# Patient Record
Sex: Male | Born: 1985 | Race: White | Hispanic: No | Marital: Married | Smoking: Never smoker
Health system: Southern US, Community
[De-identification: ages and names within clinical notes are randomized; demographics above are authoritative.]

## PROBLEM LIST (undated history)

## (undated) DIAGNOSIS — R7303 Prediabetes: Secondary | ICD-10-CM

## (undated) DIAGNOSIS — I10 Essential (primary) hypertension: Secondary | ICD-10-CM

## (undated) DIAGNOSIS — E785 Hyperlipidemia, unspecified: Secondary | ICD-10-CM

## (undated) HISTORY — DX: Essential (primary) hypertension: I10

## (undated) HISTORY — PX: TONSILLECTOMY: SUR1361

## (undated) HISTORY — DX: Hyperlipidemia, unspecified: E78.5

## (undated) HISTORY — PX: CHOLECYSTECTOMY: SHX55

## (undated) HISTORY — DX: Prediabetes: R73.03

## (undated) HISTORY — PX: KNEE ARTHROSCOPY WITH ANTERIOR CRUCIATE LIGAMENT (ACL) REPAIR: SHX5644

---
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
## 2009-01-24 ENCOUNTER — Emergency Department: Payer: Self-pay | Admitting: Emergency Medicine

## 2009-07-10 ENCOUNTER — Emergency Department: Payer: Self-pay | Admitting: Unknown Physician Specialty

## 2009-10-22 ENCOUNTER — Emergency Department: Payer: Self-pay | Admitting: Emergency Medicine

## 2009-10-26 ENCOUNTER — Emergency Department: Payer: Self-pay | Admitting: Emergency Medicine

## 2012-03-12 ENCOUNTER — Ambulatory Visit: Payer: Self-pay

## 2012-11-15 ENCOUNTER — Emergency Department: Payer: Self-pay | Admitting: Emergency Medicine

## 2013-06-03 ENCOUNTER — Emergency Department: Payer: Self-pay | Admitting: Internal Medicine

## 2013-06-03 LAB — CBC WITH DIFFERENTIAL/PLATELET
Basophil #: 0.1 10*3/uL (ref 0.0–0.1)
Basophil %: 0.5 %
Eosinophil #: 0.2 10*3/uL (ref 0.0–0.7)
Eosinophil %: 1.6 %
HCT: 46.2 % (ref 40.0–52.0)
HGB: 15.5 g/dL (ref 13.0–18.0)
Lymphocyte #: 2.1 10*3/uL (ref 1.0–3.6)
Lymphocyte %: 17.6 %
MCH: 29.1 pg (ref 26.0–34.0)
MCHC: 33.6 g/dL (ref 32.0–36.0)
MCV: 87 fL (ref 80–100)
Monocyte #: 1 x10 3/mm (ref 0.2–1.0)
Monocyte %: 8.3 %
Neutrophil #: 8.5 10*3/uL — ABNORMAL HIGH (ref 1.4–6.5)
Neutrophil %: 72 %
Platelet: 220 10*3/uL (ref 150–440)
RBC: 5.32 10*6/uL (ref 4.40–5.90)
RDW: 12.7 % (ref 11.5–14.5)
WBC: 11.8 10*3/uL — ABNORMAL HIGH (ref 3.8–10.6)

## 2013-06-03 LAB — COMPREHENSIVE METABOLIC PANEL
Albumin: 3.6 g/dL (ref 3.4–5.0)
Alkaline Phosphatase: 64 U/L
Anion Gap: 1 — ABNORMAL LOW (ref 7–16)
BUN: 12 mg/dL (ref 7–18)
Bilirubin,Total: 0.4 mg/dL (ref 0.2–1.0)
Calcium, Total: 9.4 mg/dL (ref 8.5–10.1)
Chloride: 103 mmol/L (ref 98–107)
Co2: 31 mmol/L (ref 21–32)
Creatinine: 0.84 mg/dL (ref 0.60–1.30)
EGFR (African American): >60
EGFR (Non-African Amer.): >60
Glucose: 73 mg/dL (ref 65–99)
Osmolality: 268 (ref 275–301)
Potassium: 4.5 mmol/L (ref 3.5–5.1)
SGOT(AST): 27 U/L (ref 15–37)
SGPT (ALT): 45 U/L (ref 12–78)
Sodium: 135 mmol/L — ABNORMAL LOW (ref 136–145)
Total Protein: 7.7 g/dL (ref 6.4–8.2)

## 2013-06-03 LAB — MONONUCLEOSIS SCREEN: Mono Test: NEGATIVE

## 2013-06-05 LAB — BETA STREP CULTURE(ARMC)

## 2014-05-10 ENCOUNTER — Emergency Department: Payer: Self-pay | Admitting: Emergency Medicine

## 2017-03-08 DIAGNOSIS — K8020 Calculus of gallbladder without cholecystitis without obstruction: Secondary | ICD-10-CM | POA: Insufficient documentation

## 2017-03-08 DIAGNOSIS — Z9049 Acquired absence of other specified parts of digestive tract: Secondary | ICD-10-CM | POA: Insufficient documentation

## 2017-08-07 ENCOUNTER — Emergency Department
Admission: EM | Admit: 2017-08-07 | Discharge: 2017-08-07 | Disposition: A | Discharge status: Home / Self Care | Payer: Self-pay | Attending: Emergency Medicine | Admitting: Emergency Medicine

## 2017-08-07 ENCOUNTER — Encounter: Payer: Self-pay | Admitting: Emergency Medicine

## 2017-08-07 ENCOUNTER — Other Ambulatory Visit: Payer: Self-pay

## 2017-08-07 DIAGNOSIS — K047 Periapical abscess without sinus: Secondary | ICD-10-CM | POA: Insufficient documentation

## 2017-08-07 MED ORDER — MAGIC MOUTHWASH W/LIDOCAINE: 5.0000 mL | Freq: Four times a day (QID) | ORAL | 0 refills | Status: DC

## 2017-08-07 MED ORDER — NAPROXEN 500 MG PO TABS: 500.0000 mg | ORAL_TABLET | Freq: Two times a day (BID) | ORAL | 0 refills | Status: DC

## 2017-08-07 MED ORDER — AMOXICILLIN 875 MG PO TABS: 875.0000 mg | ORAL_TABLET | Freq: Two times a day (BID) | ORAL | 0 refills | Status: DC

## 2017-08-07 MED ORDER — NAPROXEN 500 MG PO TABS
500.0000 mg | ORAL_TABLET | Freq: Once | ORAL | Status: AC
  Administered 2017-08-07: 500 mg via ORAL
  Filled 2017-08-07: qty 1

## 2017-08-07 MED ORDER — LIDOCAINE-EPINEPHRINE 2 %-1:100000 IJ SOLN
1.7000 mL | Freq: Once | INTRAMUSCULAR | Status: DC
  Filled 2017-08-07: qty 1.7

## 2017-08-07 NOTE — ED Provider Notes (Signed)
[facility] Emergency Department Provider Note  ____________________________________________  Time seen: Approximately 8:30 PM  I have reviewed the triage vital signs and the nursing notes.   HISTORY  Chief Complaint Otalgia    HPI Jonathan Sherman is a 32 y.o. male who presents the emergency department complaining of left upper dental and left ear pain.  Patient reports that this is been ongoing times 6 days.  Patient thinks he has a dental infection.  No fevers or chills, difficulty breathing or swallowing, sore throat.  Patient states that it is a throbbing sensation, constant, worse with eating especially on the left upper side.  Patient does not have a dentist.  No other complaints at this time.  Patient denies any headache, visual changes, nasal congestion, sinus pressure, sore throat, chest pain, shortness of breath, abdominal pain, nausea vomiting.  No medication for this complaint prior to arrival.  History reviewed. No pertinent past medical history.  There are no active problems to display for this patient.   Past Surgical History:  Procedure Laterality Date  . CHOLECYSTECTOMY    . KNEE ARTHROSCOPY WITH ANTERIOR CRUCIATE LIGAMENT (ACL) REPAIR Left   . TONSILLECTOMY      Prior to Admission medications   Medication Sig Start Date End Date Taking? Authorizing Provider  amoxicillin (AMOXIL) 875 MG tablet Take 1 tablet (875 mg total) by mouth 2 (two) times daily. 08/07/17   Wyatt Thorstenson, Delorise Royals, PA-C  magic mouthwash w/lidocaine SOLN Take 5 mLs by mouth 4 (four) times daily. 08/07/17   Ziomara Birenbaum, Delorise Royals, PA-C  naproxen (NAPROSYN) 500 MG tablet Take 1 tablet (500 mg total) by mouth 2 (two) times daily with a meal. 08/07/17   Levar Fayson, Delorise Royals, PA-C    Allergies Patient has no known allergies.  No family history on file.  Social History Social History   Tobacco Use  . Smoking status: Never Smoker  . Smokeless tobacco: Never Used  Substance  Use Topics  . Alcohol use: Not on file  . Drug use: Not on file     Review of Systems  Constitutional: No fever/chills Eyes: No visual changes. No discharge ENT: Left upper dental pain, left ear pain Cardiovascular: no chest pain. Respiratory: no cough. No SOB. Gastrointestinal: No abdominal pain.  No nausea, no vomiting.  No diarrhea.  No constipation. Musculoskeletal: Negative for musculoskeletal pain. Skin: Negative for rash, abrasions, lacerations, ecchymosis. Neurological: Negative for headaches, focal weakness or numbness. 10-point ROS otherwise negative.  ____________________________________________   PHYSICAL EXAM:  VITAL SIGNS: ED Triage Vitals [08/07/17 2004]  Enc Vitals Group     BP (!) 164/100     Pulse Rate 99     Resp 20     Temp 98.3 F (36.8 C)     Temp Source Oral     SpO2 98 %     Weight (!) 320 lb (145.2 kg)     Height 6' (1.829 m)     Head Circumference      Peak Flow      Pain Score 10     Pain Loc      Pain Edu?      Excl. in [place]?      Constitutional: Alert and oriented. Well appearing and in no acute distress. Eyes: Conjunctivae are normal. PERRL. EOMI. Head: Atraumatic. ENT:      Ears:       Nose: No congestion/rhinnorhea.      Mouth/Throat: Mucous membranes are moist.  Patient has a  fractured tooth #16.  Patient is very tender to palpation over this tooth.  No significant surrounding erythema or edema.  No pustular drainage.  Uvula is midline.  Oropharynx is otherwise nonerythematous and nonedematous. Neck: No stridor.  Neck is supple full range of motion Hematological/Lymphatic/Immunilogical: No cervical lymphadenopathy. Cardiovascular: Normal rate, regular rhythm. Normal S1 and S2.  Good peripheral circulation. Respiratory: Normal respiratory effort without tachypnea or retractions. Lungs CTAB. Good air entry to the bases with no decreased or absent breath sounds. Musculoskeletal: Full range of motion to all extremities. No gross  deformities appreciated. Neurologic:  Normal speech and language. No gross focal neurologic deficits are appreciated.  Skin:  Skin is warm, dry and intact. No rash noted. Psychiatric: Mood and affect are normal. Speech and behavior are normal. Patient exhibits appropriate insight and judgement.   ____________________________________________   LABS (all labs ordered are listed, but only abnormal results are displayed)  Labs Reviewed - No data to display ____________________________________________  EKG   ____________________________________________  RADIOLOGY   No results found.  ____________________________________________    PROCEDURES  Procedure(s) performed:    .Nerve Block Date/Time: 08/07/2017 8:32 PM Performed by: Racheal Patchesuthriell, Roshunda Keir D, PA-C Authorized by: Racheal Patchesuthriell, Jaidon Sponsel D, PA-C   Consent:    Consent obtained:  Verbal   Consent given by:  Patient   Risks discussed:  Pain and unsuccessful block Indications:    Indications:  Pain relief Location:    Body area:  Head   Head nerve blocked: Posterior Superior Alveolar Nerve.   Laterality:  Left Procedure details (see MAR for exact dosages):    Block needle gauge:  27 G   Anesthetic injected:  Lidocaine 1% WITH epi   Steroid injected:  None   Additive injected:  None   Injection procedure:  Anatomic landmarks identified, introduced needle, incremental injection and negative aspiration for blood Post-procedure details:    Outcome:  Pain relieved   Patient tolerance of procedure:  Tolerated well, no immediate complications      Medications  lidocaine-EPINEPHrine (XYLOCAINE W/EPI) 2 %-1:100000 (with pres) injection 1.7 mL (not administered)     ____________________________________________   INITIAL IMPRESSION / ASSESSMENT AND PLAN / ED COURSE  Pertinent labs & imaging results that were available during my care of the patient were reviewed by me and considered in my medical decision making (see  chart for details).  Review of the [place] CSRS was performed in accordance of the [facility] prior to dispensing any controlled drugs.     Patient's diagnosis is consistent with dental infection.  Patient presents the emergency department with left upper dental pain radiating to the left ear.  Exam consistent with fracture dentition with likely infection.  No indication of deep abscess or deep space infection.  Patient is given a dental block as described above for symptom relief.  Patient will be prescribed antibiotics, anti-inflammatories, Magic mouthwash for symptom control.  Patient will follow up with dentist for further management..Patient is given ED precautions to return to the ED for any worsening or new symptoms.     ____________________________________________  FINAL CLINICAL IMPRESSION(S) / ED DIAGNOSES  Final diagnoses:  Dental infection      NEW MEDICATIONS STARTED DURING THIS VISIT:  ED Discharge Orders        Ordered    amoxicillin (AMOXIL) 875 MG tablet  2 times daily     08/07/17 2057    magic mouthwash w/lidocaine SOLN  4 times daily    Comments:  Dispense in a 1/1/1  ratio. Use lidocaine, diphenhydramine, prednisolone   08/07/17 2057    naproxen (NAPROSYN) 500 MG tablet  2 times daily with meals     08/07/17 2057          This chart was dictated using voice recognition software/Dragon. Despite best efforts to proofread, errors can occur which can change the meaning. Any change was purely unintentional.    Racheal Patches, PA-C 08/07/17 2059    Minna Antis, MD 08/07/17 2311

## 2017-08-07 NOTE — ED Triage Notes (Signed)
Patient ambulatory to triage with steady gait, without difficulty or distress noted; pt reports last several days having ?left lower dental pain radiating into ear; denies recent illness

## 2018-06-23 ENCOUNTER — Encounter: Payer: Self-pay | Admitting: Emergency Medicine

## 2018-06-23 ENCOUNTER — Other Ambulatory Visit: Payer: Self-pay

## 2018-06-23 ENCOUNTER — Emergency Department
Admission: EM | Admit: 2018-06-23 | Discharge: 2018-06-23 | Disposition: A | Discharge status: Home / Self Care | Payer: Self-pay | Attending: Student in an Organized Health Care Education/Training Program | Admitting: Student in an Organized Health Care Education/Training Program

## 2018-06-23 DIAGNOSIS — Z79899 Other long term (current) drug therapy: Secondary | ICD-10-CM | POA: Insufficient documentation

## 2018-06-23 DIAGNOSIS — G5603 Carpal tunnel syndrome, bilateral upper limbs: Secondary | ICD-10-CM | POA: Insufficient documentation

## 2018-06-23 DIAGNOSIS — Z9049 Acquired absence of other specified parts of digestive tract: Secondary | ICD-10-CM | POA: Insufficient documentation

## 2018-06-23 MED ORDER — PREDNISONE 10 MG PO TABS: 10.0000 mg | ORAL_TABLET | Freq: Every day | ORAL | 0 refills | Status: DC

## 2018-06-23 NOTE — ED Provider Notes (Signed)
[facility] Emergency Department Provider Note    First MD Initiated Contact with Patient 06/23/18 1054     (approximate)  I have reviewed the triage vital signs and the nursing notes.   HISTORY  Chief Complaint Numbness    HPI Jonathan Sherman is a 33 y.o. male presents the ED R for evaluation of several months of paresthesia and pain particularly in his second and third long fingers of his right hand but affecting both hands.  States that it is worse at night and when he first wakes up in the morning.  States that when the pain is very severe it will shoot up his right arm.  Denies any chest pain or shortness of breath.  No neck pain.  No other symptoms noticed.  States that he has to roll over and wake up in the melanite and shake his hand out.  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  Just recently got health insurance and that is why is presenting to the ER.  Has not been established with PCP.  Is any other chronic medical conditions.  History reviewed. No pertinent past medical history. History reviewed. No pertinent family history. Past Surgical History:  Procedure Laterality Date  . CHOLECYSTECTOMY    . KNEE ARTHROSCOPY WITH ANTERIOR CRUCIATE LIGAMENT (ACL) REPAIR Left   . TONSILLECTOMY     There are no active problems to display for this patient.     Prior to Admission medications   Medication Sig Start Date End Date Taking? Authorizing Provider  amoxicillin (AMOXIL) 875 MG tablet Take 1 tablet (875 mg total) by mouth 2 (two) times daily. 08/07/17   Cuthriell, Delorise Royals, PA-C  magic mouthwash w/lidocaine SOLN Take 5 mLs by mouth 4 (four) times daily. 08/07/17   Cuthriell, Delorise Royals, PA-C  naproxen (NAPROSYN) 500 MG tablet Take 1 tablet (500 mg total) by mouth 2 (two) times daily with a meal. 08/07/17   Cuthriell, Delorise Royals, PA-C  predniSONE (DELTASONE) 10 MG tablet Take 1 tablet (10 mg total) by mouth daily. Day 1-2: Take 50  mg  ( 5 pills) Day 3-4 : Take 40 mg (4pills) Day 5-6: Take 30 mg (3 pills) Day 7-8:  Take 20 mg (2 pills) Day 9:  Take 10mg  (1 pill) 06/23/18   Willy Eddy, MD    Allergies Patient has no known allergies.    Social History Social History   Tobacco Use  . Smoking status: Never Smoker  . Smokeless tobacco: Never Used  Substance Use Topics  . Alcohol use: Never    Frequency: Never  . Drug use: Never    Review of Systems Patient denies headaches, rhinorrhea, blurry vision, numbness, shortness of breath, chest pain, edema, cough, abdominal pain, nausea, vomiting, diarrhea, dysuria, fevers, rashes or hallucinations unless otherwise stated above in HPI. ____________________________________________   PHYSICAL EXAM:  VITAL SIGNS: Vitals:   06/23/18 1044  BP: (!) 164/99  Pulse: 97  Resp: 18  Temp: 98.2 F (36.8 C)  SpO2: 100%    Constitutional: Alert and oriented. Well appearing and in no acute distress. Eyes: Conjunctivae are normal.  Head: Atraumatic. Nose: No congestion/rhinnorhea. Mouth/Throat: Mucous membranes are moist.   Neck: Painless ROM.  Cardiovascular:   Good peripheral circulation. Respiratory: Normal respiratory effort.  No retractions.  Gastrointestinal: Soft and nontender.  Musculoskeletal: Good cap refill to all 10 digits the upper extremities.  Symptoms are reproduced with phalens maneuver and has a positive tinnels  sign of the right.  Some paresthesia noted but silt.   no lower extremity tenderness .  No joint effusions. Neurologic:  Normal speech and language. No gross focal neurologic deficits are appreciated.  Skin:  Skin is warm, dry and intact. No rash noted. Psychiatric: Mood and affect are normal. Speech and behavior are normal.  ____________________________________________   LABS (all labs ordered are listed, but only abnormal results are displayed)  No results found for this or any previous visit (from the past 24  hour(s)). ____________________________________________ ____________________________________________   PROCEDURES  Procedure(s) performed:  Procedures    Critical Care performed: no ____________________________________________   INITIAL IMPRESSION / ASSESSMENT AND PLAN / ED COURSE  Pertinent labs & imaging results that were available during my care of the patient were reviewed by me and considered in my medical decision making (see chart for details).  DDX: carpal tunnel, radiculopathy, cord syndrome  FRAN SCHULENBERG is a 33 y.o. who presents to the ED with symptoms as described above.  Patient well-appearing.  Symptoms ongoing for several months.  Physical exam is consistent with carpal tunnel.  No evidence of more proximal lesion.  No indication for emergent diagnostic imaging as this is carpal tunnel.  Discussed conservative measures.  Will start on prednisone taper discussed anti-inflammatories as well as wrist splinting with need for orthopedic follow-up as he does have moderate symptoms.  Have discussed with the patient and available family all diagnostics and treatments performed thus far and all questions were answered to the best of my ability. The patient demonstrates understanding and agreement with plan.       ____________________________________________   FINAL CLINICAL IMPRESSION(S) / ED DIAGNOSES  Final diagnoses:  Bilateral carpal tunnel syndrome      NEW MEDICATIONS STARTED DURING THIS VISIT:  New Prescriptions   PREDNISONE (DELTASONE) 10 MG TABLET    Take 1 tablet (10 mg total) by mouth daily. Day 1-2: Take 50 mg  ( 5 pills) Day 3-4 : Take 40 mg (4pills) Day 5-6: Take 30 mg (3 pills) Day 7-8:  Take 20 mg (2 pills) Day 9:  Take 10mg  (1 pill)     Note:  This document was prepared using Dragon voice recognition software and may include unintentional dictation errors.     Willy Eddy, MD 06/23/18 1115

## 2018-06-23 NOTE — ED Triage Notes (Signed)
Pt here for numbness to both hands intermittent mostly while sleeping over last 3 weeks.  The numbness has been getting more consistent and occurring during day as well and now has not gone away in right hand.  just got insurance and working on getting PCP.  No injury or neck pain.

## 2018-11-11 ENCOUNTER — Emergency Department: Payer: 59

## 2018-11-11 ENCOUNTER — Encounter: Payer: Self-pay | Admitting: *Deleted

## 2018-11-11 ENCOUNTER — Other Ambulatory Visit: Payer: Self-pay

## 2018-11-11 ENCOUNTER — Emergency Department
Admission: EM | Admit: 2018-11-11 | Discharge: 2018-11-12 | Discharge status: Against Medical Advice | Payer: 59 | Attending: Emergency Medicine | Admitting: Emergency Medicine

## 2018-11-11 DIAGNOSIS — Z79899 Other long term (current) drug therapy: Secondary | ICD-10-CM | POA: Insufficient documentation

## 2018-11-11 DIAGNOSIS — N200 Calculus of kidney: Secondary | ICD-10-CM

## 2018-11-11 DIAGNOSIS — N130 Hydronephrosis with ureteropelvic junction obstruction: Secondary | ICD-10-CM | POA: Insufficient documentation

## 2018-11-11 DIAGNOSIS — R109 Unspecified abdominal pain: Secondary | ICD-10-CM | POA: Diagnosis present

## 2018-11-11 LAB — URINALYSIS, COMPLETE (UACMP) WITH MICROSCOPIC
Bacteria, UA: NONE SEEN
Bilirubin Urine: NEGATIVE
Glucose, UA: NEGATIVE mg/dL
Ketones, ur: NEGATIVE mg/dL
Leukocytes,Ua: NEGATIVE
Nitrite: NEGATIVE
Protein, ur: 30 mg/dL — AB
RBC / HPF: >50 RBC/hpf — ABNORMAL HIGH (ref 0–5)
Specific Gravity, Urine: 1.031 — ABNORMAL HIGH (ref 1.005–1.030)
pH: 5 (ref 5.0–8.0)

## 2018-11-11 MED ORDER — SODIUM CHLORIDE 0.9 % IV BOLUS: 1000.0000 mL | Freq: Once | INTRAVENOUS | Status: DC

## 2018-11-11 MED ORDER — OXYCODONE-ACETAMINOPHEN 5-325 MG PO TABS
1.0000 | ORAL_TABLET | ORAL | Status: AC | PRN
  Administered 2018-11-11 – 2018-11-12 (×2): 1 via ORAL
  Filled 2018-11-11 (×2): qty 1

## 2018-11-11 MED ORDER — TAMSULOSIN HCL 0.4 MG PO CAPS
0.4000 mg | ORAL_CAPSULE | Freq: Once | ORAL | Status: AC
  Administered 2018-11-12: 0.4 mg via ORAL
  Filled 2018-11-11: qty 1

## 2018-11-11 MED ORDER — KETOROLAC TROMETHAMINE 30 MG/ML IJ SOLN
15.0000 mg | Freq: Once | INTRAMUSCULAR | Status: DC
  Filled 2018-11-11: qty 1

## 2018-11-11 MED ORDER — ONDANSETRON HCL 4 MG/2ML IJ SOLN
4.0000 mg | Freq: Once | INTRAMUSCULAR | Status: DC
  Filled 2018-11-11: qty 2

## 2018-11-11 NOTE — ED Triage Notes (Signed)
Pt to Ed with left sided flank pain x 6 hours. Pain is not changing with movement or rest. Pt reports having tried heat pads without success. Ibuprofen taken at home with no relief. PT has been able to urinate but reports a tingling when doing so. No hx of kidney stones.

## 2018-11-11 NOTE — ED Provider Notes (Signed)
[facility] Emergency Department Provider Note  ____________________________________________  Time seen: Approximately 11:20 PM  I have reviewed the triage vital signs and the nursing notes.   HISTORY  Chief Complaint Flank Pain   HPI Jonathan Sherman is a 33 y.o. male no significant past medical history presents for evaluation of flank pain.  Pain started today.  Sharp, located in the left flank, constant and nonradiating, currently 8 out of 10, associated with nausea.  No vomiting, no fever, no dysuria or hematuria.  No prior history of kidney stones.  No chest pain, cough, shortness of breath, or fever.  PMH Obesity  Past Surgical History:  Procedure Laterality Date  . CHOLECYSTECTOMY    . KNEE ARTHROSCOPY WITH ANTERIOR CRUCIATE LIGAMENT (ACL) REPAIR Left   . TONSILLECTOMY      Prior to Admission medications   Medication Sig Start Date End Date Taking? Authorizing Provider  amoxicillin (AMOXIL) 875 MG tablet Take 1 tablet (875 mg total) by mouth 2 (two) times daily. 08/07/17   Cuthriell, Charline Bills, PA-C  magic mouthwash w/lidocaine SOLN Take 5 mLs by mouth 4 (four) times daily. 08/07/17   Cuthriell, Charline Bills, PA-C  naproxen (NAPROSYN) 500 MG tablet Take 1 tablet (500 mg total) by mouth 2 (two) times daily with a meal. 08/07/17   Cuthriell, Charline Bills, PA-C  predniSONE (DELTASONE) 10 MG tablet Take 1 tablet (10 mg total) by mouth daily. Day 1-2: Take 50 mg  ( 5 pills) Day 3-4 : Take 40 mg (4pills) Day 5-6: Take 30 mg (3 pills) Day 7-8:  Take 20 mg (2 pills) Day 9:  Take 10mg  (1 pill) 06/23/18   Merlyn Lot, MD    Allergies Patient has no known allergies.  FH Diabetes Brother    Diabetes Brother    Heart disease Father    Cancer Mother    Dementia Mother       Social History Social History   Tobacco Use  . Smoking status: Never Smoker  . Smokeless tobacco: Never Used  Substance Use Topics  . Alcohol use: Never   Frequency: Never  . Drug use: Never    Review of Systems  Constitutional: Negative for fever. Eyes: Negative for visual changes. ENT: Negative for sore throat. Neck: No neck pain  Cardiovascular: Negative for chest pain. Respiratory: Negative for shortness of breath. Gastrointestinal: Negative for abdominal pain, vomiting or diarrhea. Genitourinary: Negative for dysuria. + L flank pain Musculoskeletal: Negative for back pain. Skin: Negative for rash. Neurological: Negative for headaches, weakness or numbness. Psych: No SI or HI  ____________________________________________   PHYSICAL EXAM:  VITAL SIGNS: ED Triage Vitals [11/11/18 1900]  Enc Vitals Group     BP (!) 151/99     Pulse Rate (!) 103     Resp 16     Temp 98.7 F (37.1 C)     Temp Source Oral     SpO2 97 %     Weight (!) 320 lb 1.7 oz (145.2 kg)     Height 5\' 10"  (1.778 m)     Head Circumference      Peak Flow      Pain Score 10     Pain Loc      Pain Edu?      Excl. in [place]?     Constitutional: Alert and oriented. Well appearing and in no apparent distress. HEENT:      Head: Normocephalic and atraumatic.  Eyes: Conjunctivae are normal. Sclera is non-icteric.       Mouth/Throat: Mucous membranes are moist.       Neck: Supple with no signs of meningismus. Cardiovascular: Tachycardic with regular rhythm. No murmurs, gallops, or rubs. 2+ symmetrical distal pulses are present in all extremities. No JVD. Respiratory: Normal respiratory effort. Lungs are clear to auscultation bilaterally. No wheezes, crackles, or rhonchi.  Gastrointestinal: Soft, non tender, and non distended with positive bowel sounds. No rebound or guarding. Genitourinary: L CVA tenderness. Musculoskeletal: Nontender with normal range of motion in all extremities. No edema, cyanosis, or erythema of extremities. Neurologic: Normal speech and language. Face is symmetric. Moving all extremities. No gross focal neurologic deficits are  appreciated. Skin: Skin is warm, dry and intact. No rash noted. Psychiatric: Mood and affect are normal. Speech and behavior are normal.  ____________________________________________   LABS (all labs ordered are listed, but only abnormal results are displayed)  Labs Reviewed  URINALYSIS, COMPLETE (UACMP) WITH MICROSCOPIC - Abnormal; Notable for the following components:      Result Value   Color, Urine YELLOW (*)    APPearance HAZY (*)    Specific Gravity, Urine 1.031 (*)    Hgb urine dipstick LARGE (*)    Protein, ur 30 (*)    RBC / HPF >50 (*)    All other components within normal limits  CBC WITH DIFFERENTIAL/PLATELET  COMPREHENSIVE METABOLIC PANEL   ____________________________________________  EKG  None  ____________________________________________  RADIOLOGY  I have personally reviewed the images performed during this visit and I agree with the Radiologist's read.   Interpretation by Radiologist:  Ct Renal Stone Study  Result Date: 11/11/2018 CLINICAL DATA:  Left flank pain. EXAM: CT ABDOMEN AND PELVIS WITHOUT CONTRAST TECHNIQUE: Multidetector CT imaging of the abdomen and pelvis was performed following the standard protocol without IV contrast. COMPARISON:  None. FINDINGS: Lower chest: Scattered subsegmental atelectasis. No pleural fluid or consolidation. Hepatobiliary: Enlarged liver spanning 23.6 cm. Diffuse hepatic steatosis. No focal abnormality. Clips in the gallbladder fossa postcholecystectomy. No biliary dilatation. Pancreas: No ductal dilatation or inflammation. Spleen: Upper normal in size at 14.1 cm AP. Punctate granuloma. Adrenals/Urinary Tract: Normal adrenal glands. Obstructing 4 mm stone at the left ureterovesicular junction with mild hydroureteronephrosis and perinephric edema. No right hydronephrosis. No additional nonobstructing stone in either kidney. Urinary bladder is partially distended. No bladder stone. Stomach/Bowel: Stomach distended with ingested  material. Appendix appears normal. No evidence of bowel wall thickening, distention, or inflammatory changes. Vascular/Lymphatic: Enlarged lymph nodes in the abdomen or pelvis. Negative noncontrast vascular structures. Reproductive: Prostate is unremarkable. Other: No free air, free fluid, or intra-abdominal fluid collection. Musculoskeletal: Incidental vertebral body hemangioma within L2. L5-S1 degenerative disc disease. There are no acute or suspicious osseous abnormalities. IMPRESSION: 1. Obstructing 4 mm stone at the left ureterovesicular junction with mild hydronephrosis. 2. Hepatic steatosis and hepatomegaly. Electronically Signed   By: Narda RutherfordMelanie  Sanford M.D.   On: 11/11/2018 23:41     ____________________________________________   PROCEDURES  Procedure(s) performed: None Procedures Critical Care performed:  None ____________________________________________   INITIAL IMPRESSION / ASSESSMENT AND PLAN / ED COURSE   33 y.o. male no significant past medical history presents for evaluation of flank pain.  Presentation concerning for kidney stone.  CT confirms an obstructing 4 mm stone at the left UVJ with mild hydronephrosis.  UA with no overlying infection.  Will give Flomax, Toradol, Zofran.  Will check basic labs.  Anticipate discharge home once pain is well controlled with  follow-up with urology    _________________________ 1:25 AM on 11/12/2018 -----------------------------------------  Patient received flomax and percocet. Discussed the results of CT, follow up with Urology and standard return precautions. Patient was a hard stick. IV team was consulted and patient was waiting for IV placement and blood work. I went back to re-evaluate patient after percocet and flomax and patient had left prior to labs with no prescriptions.    As part of my medical decision making, I reviewed the following data within the electronic MEDICAL RECORD NUMBER Nursing notes reviewed and incorporated, Old chart  reviewed, Radiograph reviewed , Notes from prior ED visits and [place] Controlled Substance Database    Pertinent labs & imaging results that were available during my care of the patient were reviewed by me and considered in my medical decision making (see chart for details).    ____________________________________________   FINAL CLINICAL IMPRESSION(S) / ED DIAGNOSES  Final diagnoses:  Kidney stone      NEW MEDICATIONS STARTED DURING THIS VISIT:  ED Discharge Orders    None       Note:  This document was prepared using Dragon voice recognition software and may include unintentional dictation errors.    Don PerkingVeronese, [place], MD 11/12/18 [phone]

## 2018-11-12 NOTE — ED Notes (Signed)
Pt appears to have left the ED.

## 2018-12-12 ENCOUNTER — Other Ambulatory Visit: Payer: Self-pay

## 2018-12-12 ENCOUNTER — Emergency Department: Payer: 59

## 2018-12-12 ENCOUNTER — Emergency Department
Admission: EM | Admit: 2018-12-12 | Discharge: 2018-12-12 | Disposition: A | Discharge status: Home / Self Care | Payer: 59 | Attending: Emergency Medicine | Admitting: Emergency Medicine

## 2018-12-12 DIAGNOSIS — Y939 Activity, unspecified: Secondary | ICD-10-CM | POA: Insufficient documentation

## 2018-12-12 DIAGNOSIS — X501XXA Overexertion from prolonged static or awkward postures, initial encounter: Secondary | ICD-10-CM | POA: Insufficient documentation

## 2018-12-12 DIAGNOSIS — Y990 Civilian activity done for income or pay: Secondary | ICD-10-CM | POA: Insufficient documentation

## 2018-12-12 DIAGNOSIS — S8992XA Unspecified injury of left lower leg, initial encounter: Secondary | ICD-10-CM | POA: Insufficient documentation

## 2018-12-12 DIAGNOSIS — Y929 Unspecified place or not applicable: Secondary | ICD-10-CM | POA: Diagnosis not present

## 2018-12-12 NOTE — ED Triage Notes (Signed)
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

## 2018-12-12 NOTE — ED Notes (Signed)
See triage note  States he slipped about 1 week ago while at work  Was not seen at that time  But this am states his left knee buckled while at home .

## 2018-12-12 NOTE — ED Provider Notes (Signed)
[facility] Emergency Department Provider Note   ____________________________________________   First MD Initiated Contact with Patient 12/12/18 [id number]     (approximate)  I have reviewed the triage vital signs and the nursing notes.   HISTORY  Chief Complaint Knee Injury    HPI Jonathan Sherman is a 33 y.o. male patient complain of right knee pain and edema secondary to a twisting incident at work 1 week ago.  Patient stated no relief of over-the-counter anti-inflammatory medication and wearing a patella brace.  Patient rates the pain as a 5/10.  Patient described the pain as "achy".  Medical history is significant for ACL repair 13 years ago.         History reviewed. No pertinent past medical history.  There are no active problems to display for this patient.   Past Surgical History:  Procedure Laterality Date  . CHOLECYSTECTOMY    . KNEE ARTHROSCOPY WITH ANTERIOR CRUCIATE LIGAMENT (ACL) REPAIR Left   . TONSILLECTOMY      Prior to Admission medications   Not on File    Allergies Patient has no known allergies.  No family history on file.  Social History Social History   Tobacco Use  . Smoking status: Never Smoker  . Smokeless tobacco: Never Used  Substance Use Topics  . Alcohol use: Never    Frequency: Never  . Drug use: Never    Review of Systems  Constitutional: No fever/chills Eyes: No visual changes. ENT: No sore throat. Cardiovascular: Denies chest pain. Respiratory: Denies shortness of breath. Gastrointestinal: No abdominal pain.  No nausea, no vomiting.  No diarrhea.  No constipation. Genitourinary: Negative for dysuria. Musculoskeletal: Left knee pain.   Skin: Negative for rash. Neurological: Negative for headaches, focal weakness or numbness.   ____________________________________________   PHYSICAL EXAM:  VITAL SIGNS: ED Triage Vitals  Enc Vitals Group     BP 12/12/18 0858 (!) 150/105     Pulse Rate  12/12/18 0858 (!) 110     Resp 12/12/18 0858 20     Temp 12/12/18 0858 98.8 F (37.1 C)     Temp Source 12/12/18 0858 Oral     SpO2 12/12/18 0858 99 %     Weight 12/12/18 0854 (!) 320 lb (145.2 kg)     Height 12/12/18 0854 6' (1.829 m)     Head Circumference --      Peak Flow --      Pain Score 12/12/18 0854 5     Pain Loc --      Pain Edu? --      Excl. in GC? --     Constitutional: Alert and oriented. Well appearing and in no acute distress.  Morbid obesity. Cardiovascular: Normal rate, regular rhythm. Grossly normal heart sounds.  Good peripheral circulation.  Elevated blood pressure. Respiratory: Normal respiratory effort.  No retractions. Lungs CTAB. Musculoskeletal: No obvious deformity.  Moderate guarding palpation anterior and medial patella.  No effusion. Neurologic:  Normal speech and language. No gross focal neurologic deficits are appreciated. No gait instability. Skin:  Skin is warm, dry and intact. No rash noted. Psychiatric: Mood and affect are normal. Speech and behavior are normal.  ____________________________________________   LABS (all labs ordered are listed, but only abnormal results are displayed)  Labs Reviewed - No data to display ____________________________________________  EKG   ____________________________________________  RADIOLOGY  ED MD interpretation:    Official radiology report(s): Dg Knee Complete 4 Views Left  Result Date: 12/12/2018 CLINICAL  DATA:  Twisting injury.  Knee pain. EXAM: LEFT KNEE - COMPLETE 4+ VIEW COMPARISON:  None. FINDINGS: Prior ACL repair. Mild osteoarthritis with joint space narrowing and spurring in all 3 compartments. No acute bony abnormality. Specifically, no fracture, subluxation, or dislocation. No joint effusion. IMPRESSION: No acute bony abnormality. Electronically Signed   By: Rolm Baptise M.D.   On: 12/12/2018 10:05    ____________________________________________   PROCEDURES  Procedure(s) performed  (including Critical Care):  Procedures   ____________________________________________   INITIAL IMPRESSION / ASSESSMENT AND PLAN / ED COURSE  As part of my medical decision making, I reviewed the following data within the [place] was evaluated in Emergency Department on 12/12/2018 for the symptoms described in the history of present illness. He was evaluated in the context of the global COVID-19 pandemic, which necessitated consideration that the patient might be at risk for infection with the SARS-CoV-2 virus that causes COVID-19. Institutional protocols and algorithms that pertain to the evaluation of patients at risk for COVID-19 are in a state of rapid change based on information released by regulatory bodies including the CDC and federal and state organizations. These policies and algorithms were followed during the patient's care in the ED.   Left knee pain secondary to a twisting incident.  Differential diagnosis consists sprain versus internal derangement.  X-rays were grossly unremarkable except for postsurgical findings.  Patient will follow orthopedic for definitive evaluation and treatment.      ____________________________________________   FINAL CLINICAL IMPRESSION(S) / ED DIAGNOSES  Final diagnoses:  Injury of left knee, initial encounter     ED Discharge Orders    None       Note:  This document was prepared using Dragon voice recognition software and may include unintentional dictation errors.    Sable Feil, PA-C 12/12/18 1017    Carrie Mew, MD 12/12/18 [phone]

## 2018-12-12 NOTE — ED Notes (Signed)
Per Standley Brooking from [employer]  States this was not w/c at this time b/c it buckled while at home

## 2020-03-26 ENCOUNTER — Other Ambulatory Visit: Payer: Self-pay

## 2020-03-26 ENCOUNTER — Encounter: Payer: Self-pay | Admitting: Podiatry

## 2020-03-26 ENCOUNTER — Ambulatory Visit (INDEPENDENT_AMBULATORY_CARE_PROVIDER_SITE_OTHER): Payer: Commercial Managed Care - PPO

## 2020-03-26 ENCOUNTER — Ambulatory Visit: Payer: 59 | Admitting: Podiatry

## 2020-03-26 DIAGNOSIS — M722 Plantar fascial fibromatosis: Secondary | ICD-10-CM | POA: Diagnosis not present

## 2020-03-26 NOTE — Progress Notes (Signed)
  Subjective:  Patient ID: Jonathan Sherman, male    DOB: 04-Aug-1985,  MRN: [id number]  Chief Complaint  Patient presents with  . Foot Pain    Patient presents today for right heel pain x 1 week    34 y.o. male presents with the above complaint.  Patient presents with complaint of right heel pain that has been going for past 1 week as progressing on worse.  Patient diagnosed himself with plantar fasciitis.  He states that it is painful to walk on.  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  He states that is painful when waking up in the first morning taking the first step.  He has not tried any treatment options he would like to discuss treatment options he has not seen anyone else prior to seeing me.   Review of Systems: Negative except as noted in the HPI. Denies N/V/F/Ch.  History reviewed. No pertinent past medical history.  Current Outpatient Medications:  .  acetaminophen (TYLENOL) 325 MG tablet, Take by mouth., Disp: , Rfl:  .  hydrochlorothiazide (HYDRODIURIL) 25 MG tablet, Take by mouth., Disp: , Rfl:  .  ibuprofen (ADVIL) 200 MG tablet, Take by mouth., Disp: , Rfl:   Social History   Tobacco Use  Smoking Status Never Smoker  Smokeless Tobacco Never Used    No Known Allergies Objective:  There were no vitals filed for this visit. There is no height or weight on file to calculate BMI. Constitutional Well developed. Well nourished.  Vascular Dorsalis pedis pulses palpable bilaterally. Posterior tibial pulses palpable bilaterally. Capillary refill normal to all digits.  No cyanosis or clubbing noted. Pedal hair growth normal.  Neurologic Normal speech. Oriented to person, place, and time. Epicritic sensation to light touch grossly present bilaterally.  Dermatologic Nails well groomed and normal in appearance. No open wounds. No skin lesions.  Orthopedic: Normal joint ROM without pain or crepitus bilaterally. No visible  deformities. Tender to palpation at the calcaneal tuber right. No pain with calcaneal squeeze right. Ankle ROM diminished range of motion right. Silfverskiold Test: positive right.   Radiographs: Taken and reviewed. No acute fractures or dislocations. No evidence of stress fracture.  Plantar heel spur present. Posterior heel spur present.   Assessment:   1. Plantar fasciitis of right foot    Plan:  Patient was evaluated and treated and all questions answered.  Plantar Fasciitis, right - XR reviewed as above.  - Educated on icing and stretching. Instructions given.  - Injection delivered to the plantar fascia as below. - DME: Plantar Fascial Brace - Pharmacologic management: None  Procedure: Injection Tendon/Ligament Location: Right plantar fascia at the glabrous junction; medial approach. Skin Prep: alcohol Injectate: 0.5 cc 0.5% marcaine plain, 0.5 cc of 1% Lidocaine, 0.5 cc kenalog 10. Disposition: Patient tolerated procedure well. Injection site dressed with a band-aid.  No follow-ups on file.

## 2020-04-23 ENCOUNTER — Ambulatory Visit: Payer: Commercial Managed Care - PPO | Admitting: Podiatry

## 2020-09-23 ENCOUNTER — Ambulatory Visit: Payer: Commercial Managed Care - PPO | Attending: Otolaryngology

## 2020-09-23 DIAGNOSIS — G4733 Obstructive sleep apnea (adult) (pediatric): Secondary | ICD-10-CM | POA: Insufficient documentation

## 2020-09-24 ENCOUNTER — Other Ambulatory Visit: Payer: Self-pay

## 2021-09-01 ENCOUNTER — Other Ambulatory Visit: Payer: Self-pay

## 2021-09-01 ENCOUNTER — Encounter: Payer: Self-pay | Admitting: Emergency Medicine

## 2021-09-01 ENCOUNTER — Emergency Department
Admission: EM | Admit: 2021-09-01 | Discharge: 2021-09-01 | Disposition: A | Discharge status: Home / Self Care | Payer: Commercial Managed Care - PPO | Attending: Student in an Organized Health Care Education/Training Program | Admitting: Student in an Organized Health Care Education/Training Program

## 2021-09-01 ENCOUNTER — Emergency Department: Payer: Commercial Managed Care - PPO

## 2021-09-01 DIAGNOSIS — X500XXA Overexertion from strenuous movement or load, initial encounter: Secondary | ICD-10-CM | POA: Insufficient documentation

## 2021-09-01 DIAGNOSIS — M7711 Lateral epicondylitis, right elbow: Secondary | ICD-10-CM | POA: Insufficient documentation

## 2021-09-01 DIAGNOSIS — M25521 Pain in right elbow: Secondary | ICD-10-CM | POA: Diagnosis present

## 2021-09-01 MED ORDER — DEXAMETHASONE SODIUM PHOSPHATE 10 MG/ML IJ SOLN
10.0000 mg | Freq: Once | INTRAMUSCULAR | Status: AC
  Administered 2021-09-01: 10 mg via INTRAMUSCULAR
  Filled 2021-09-01: qty 1

## 2021-09-01 MED ORDER — IBUPROFEN 800 MG PO TABS
800.0000 mg | ORAL_TABLET | Freq: Once | ORAL | Status: AC
  Administered 2021-09-01: 800 mg via ORAL

## 2021-09-01 MED ORDER — PREDNISONE 10 MG PO TABS: ORAL_TABLET | ORAL | 0 refills | Status: DC

## 2021-09-01 MED ORDER — IBUPROFEN 800 MG PO TABS
ORAL_TABLET | ORAL | Status: AC
  Filled 2021-09-01: qty 1

## 2021-09-01 NOTE — ED Provider Notes (Signed)
? ?[facility] ?Provider Note ? ? ? Event Date/Time  ? First MD Initiated Contact with Patient 09/01/21 0913   ?  (approximate) ? ? ?History  ? ?Elbow Pain ? ? ?HPI ? ?NOVA EVETT is a 36 y.o. male   presents to the ED with complaint of right elbow pain x1 day.  Patient denies any recent injury or previous elbow problems.  Patient reports pain with movement which is worsened with grabbing something to lift.  Patient states that lifting anything within the right hand causes pain at the elbow but also along the forearm as well.  No over-the-counter medication has been taken.  Patient denies any medical problems and is a non-smoker. ? ?  ? ? ?Physical Exam  ? ?Triage Vital Signs: ?ED Triage Vitals  ?Enc Vitals Group  ?   BP 09/01/21 0910 (!) 159/99  ?   Pulse Rate 09/01/21 0910 (!) 101  ?   Resp 09/01/21 0910 18  ?   Temp 09/01/21 0910 98.2 ?F (36.8 ?C)  ?   Temp Source 09/01/21 0910 Oral  ?   SpO2 09/01/21 0910 96 %  ?   Weight 09/01/21 0907 (!) 370 lb (167.8 kg)  ?   Height 09/01/21 0907 6' (1.829 m)  ?   Head Circumference --   ?   Peak Flow --   ?   Pain Score 09/01/21 0907 10  ?   Pain Loc --   ?   Pain Edu? --   ?   Excl. in GC? --   ? ? ?Most recent vital signs: ?Vitals:  ? 09/01/21 0910  ?BP: (!) 159/99  ?Pulse: (!) 101  ?Resp: 18  ?Temp: 98.2 ?F (36.8 ?C)  ?SpO2: 96%  ? ? ? ?General: Awake, no distress.  ?CV:  Good peripheral perfusion.  ?Resp:  Normal effort.  ?Abd:  No distention.  ?Other:  On examination of the right elbow there is no gross deformity however there is marked tenderness on palpation of the lateral epicondylar area.  Pain is worsened with grasp of his right hand.  Also movement of the wrist increases his pain.  Skin is intact.  No erythema or warmth is noted to the elbow area.  Patient is able to flex and extend his right upper extremity, rotation is more difficult as it increases his pain.  Radial pulses present. ? ? ?ED Results / Procedures / Treatments  ? ?Labs ?(all  labs ordered are listed, but only abnormal results are displayed) ?Labs Reviewed - No data to display ? ? ?RADIOLOGY ?Right elbow x-ray images were reviewed by myself independently of the radiologist and was negative for fracture or spurring.  Radiology report also reports no bony abnormalities but cannot rule out a small effusion. ? ? ? ?PROCEDURES: ? ?Critical Care performed:  ? ?Procedures ? ? ?MEDICATIONS ORDERED IN ED: ?Medications  ?dexamethasone (DECADRON) injection 10 mg (has no administration in time range)  ? ? ? ?IMPRESSION / MDM / ASSESSMENT AND PLAN / ED COURSE  ?I reviewed the triage vital signs and the nursing notes. ? ? ?Differential diagnosis includes, but is not limited to, acute right elbow pain, bursitis, epicondylitis, bursitis, gouty arthritis, tendinitis. ? ?36 year old male presents to the ED with complaint of right elbow pain this morning.  Patient is right-handed and states that he has noticed that with grabbing things to lift with his right hand he has increased pain near his elbow area.  He denies any  recent injury or previous elbow problems.  He has not taken any over-the-counter medications for this.  On exam he has moderately tender lateral epicondylar area.  No erythema or warmth is noted.  X-rays were negative for acute bony injury but cannot rule out a small effusion.  I spoke with patient about a tennis elbow splint when he goes to pick up his medication at [facility].  He was made aware that if this does not help or he does not seem to be improving he should follow-up with the [facility] orthopedic department and Dr. Binnie Rail information was listed on his discharge papers. ?  ? ? ?FINAL CLINICAL IMPRESSION(S) / ED DIAGNOSES  ? ?Final diagnoses:  ?Lateral epicondylitis of right elbow  ? ? ? ?Rx / DC Orders  ? ?ED Discharge Orders   ? ?      Ordered  ?  predniSONE (DELTASONE) 10 MG tablet       ? 09/01/21 1002  ? ?  ?  ? ?  ? ? ? ?Note:  This document was prepared using Dragon voice  recognition software and may include unintentional dictation errors. ?  ?Tommi Rumps, PA-C ?09/01/21 1017 ? ?  ?Willy Eddy, MD ?09/01/21 1038 ? ?

## 2021-09-01 NOTE — ED Triage Notes (Signed)
Pt in with co right elbow pain, denies any injury. Pain worse on movement.  ?

## 2021-09-01 NOTE — Discharge Instructions (Signed)
Begin taking prednisone tapering dose beginning with 6 tablets today and tapering down by 1 tablet each day.  You may use ice to your elbow as needed for discomfort which may help reduce pain.  Also while you are at [facility] picking up your medication look in their sports section to see if they have a tennis elbow splint which is the piece of Velcro that I was telling you about that you put on the upper part of your forearm.  After you finish the steroid you may take Aleve 2 tablets twice a day with food.  Avoid lifting anything over 25 pounds with your right hand only until this is calm down.  If not improving follow-up with Dr. Joice Lofts who is the orthopedist on-call and his office is at [facility].  Call make an appointment for reevaluation. ?

## 2022-08-07 ENCOUNTER — Other Ambulatory Visit: Payer: Self-pay

## 2022-08-07 ENCOUNTER — Emergency Department
Admission: EM | Admit: 2022-08-07 | Discharge: 2022-08-07 | Disposition: A | Discharge status: Home / Self Care | Payer: Commercial Managed Care - PPO | Attending: Emergency Medicine | Admitting: Emergency Medicine

## 2022-08-07 ENCOUNTER — Emergency Department: Payer: Commercial Managed Care - PPO

## 2022-08-07 DIAGNOSIS — R030 Elevated blood-pressure reading, without diagnosis of hypertension: Secondary | ICD-10-CM | POA: Insufficient documentation

## 2022-08-07 DIAGNOSIS — M79671 Pain in right foot: Secondary | ICD-10-CM | POA: Diagnosis present

## 2022-08-07 DIAGNOSIS — F419 Anxiety disorder, unspecified: Secondary | ICD-10-CM | POA: Insufficient documentation

## 2022-08-07 MED ORDER — PREDNISONE 10 MG PO TABS: ORAL_TABLET | ORAL | 0 refills | Status: DC

## 2022-08-07 MED ORDER — HYDROCODONE-ACETAMINOPHEN 5-325 MG PO TABS: 1.0000 | ORAL_TABLET | Freq: Four times a day (QID) | ORAL | 0 refills | Status: DC | PRN

## 2022-08-07 NOTE — ED Notes (Signed)
Pt discharge to home. Pt VSS, GCS 15, NAD. Pt verbalized understanding of discharge instructions with no additional questions at this time.  

## 2022-08-07 NOTE — ED Notes (Signed)
Boot applied to pt right LE. Pt educated on use of boot and crutches. Pt verbalized understanding with no additional questions or concerns.

## 2022-08-07 NOTE — Discharge Instructions (Addendum)
Follow-up with your primary care provider to have your blood pressure rechecked as it was elevated in the emergency department and 158/110.  If you do not have a primary care provider a list of clinics is on your discharge papers for you to call make an appointment.  Also Dr. Sabra Heck is on-call for orthopedics and his contact information is listed on your discharge papers along with his address.  Follow-up with orthopedics and wear the cam walker for 7 to 10 days for support and protection.  Use the crutches as needed.  The steroids are tapering dose starting with 6 tablets today and tapering down by 1 tablet each day.  The hydrocodone is a pain medication and could cause drowsiness.  Do not drive or operate machinery while taking this medication as it could increase your chances for injury.   Please go to the following website to schedule new (and existing) patient appointments:   [email or web address]   The following is a list of primary care offices in the area who are accepting new patients at this time.  Please reach out to one of them directly and let them know you would like to schedule an appointment to follow up on an Emergency Department visit, and/or to establish a new primary care provider (PCP).  There are likely other primary care clinics in the are who are accepting new patients, but this is an excellent place to start:  [place] physician: Dr Lavon Paganini [address] [place], [place] [address] [phone]  [facility] Lead Physician: Dr Steele Sizer [address], [place], [place] [address] [phone]  [place] Physician: Dr Park Liter [address] [place], [place] [address] [phone]  [facility] Lead Physician: Dr Dewaine Oats [address] [place], [place] [address] [phone]  [facility] Physician: Dr Halina Maidens [address] [place], [place], [place] [address] [phone]

## 2022-08-07 NOTE — ED Provider Notes (Signed)
[facility] Provider Note    Event Date/Time   First MD Initiated Contact with Patient 08/07/22 [phone]     (approximate)   History   Foot Pain   HPI  Jonathan Sherman is a 37 y.o. male   presents to the ED with concerns of possible ruptured Achilles tendon to his right foot.  Patient denies any recent injury and states that he has had a history of Achilles tendinitis.  Patient states that he began having pain when he got into bed last evening.  He has been ambulatory since this event but continues to have pain.      Physical Exam   Triage Vital Signs: ED Triage Vitals  Enc Vitals Group     BP 08/07/22 0541 (!) 158/110     Pulse Rate 08/07/22 0541 97     Resp 08/07/22 0541 16     Temp 08/07/22 0541 98.4 F (36.9 C)     Temp Source 08/07/22 0541 Oral     SpO2 08/07/22 0541 96 %     Weight 08/07/22 0542 (!) 380 lb (172.4 kg)     Height 08/07/22 0542 6' (1.829 m)     Head Circumference --      Peak Flow --      Pain Score 08/07/22 0542 10     Pain Loc --      Pain Edu? --      Excl. in [place]? --     Most recent vital signs: Vitals:   08/07/22 0541 08/07/22 0800  BP: (!) 158/110 (!) 136/92  Pulse: 97 88  Resp: 16 16  Temp: 98.4 F (36.9 C) 98.4 F (36.9 C)  SpO2: 96% 96%     General: Awake, no distress.  CV:  Good peripheral perfusion.  Resp:  Normal effort.  Abd:  No distention.  Other:  On examination of the right ankle and foot there is no gross deformity and no discoloration noted.  No point tenderness on palpation of the Achilles tendon.  [place] Silos sign is negative for complete Achilles rupture.  Pulses are present, motor or sensory function intact.  Moderate tenderness on palpation of the posterior foot.   ED Results / Procedures / Treatments   Labs (all labs ordered are listed, but only abnormal results are displayed) Labs Reviewed - No data to display    RADIOLOGY Right ankle x-ray images were reviewed and interpreted by myself  independent of the radiologist and no acute bony abnormality is noted.  Patient does have calcaneal spurs present.    PROCEDURES:  Critical Care performed:   Procedures   MEDICATIONS ORDERED IN ED: Medications - No data to display   IMPRESSION / MDM / [place] / ED COURSE  I reviewed the triage vital signs and the nursing notes.   Differential diagnosis includes, but is not limited to, Achilles rupture, partial Achilles tear, strain, sprain, tendinitis.  37 year old male presents to the ED with complaint of right posterior foot pain and anxious about an Achilles tendon rupture without known injury.  Patient is tender on palpation posteriorly however Thompson sign does not support a complete Achilles tendon rupture.  Patient was placed on prednisone and also hydrocodone.  He was placed in a cam walker and given crutches to use.  He is to follow-up with Earnestine Leys who is on-call for orthopedics.  Patient was encouraged to ice and elevate and to continue using the cam walker until he is able to  bear weight without pain.      Patient's presentation is most consistent with acute complicated illness / injury requiring diagnostic workup.  FINAL CLINICAL IMPRESSION(S) / ED DIAGNOSES   Final diagnoses:  Acute foot pain, right  Elevated blood pressure reading     Rx / DC Orders   ED Discharge Orders          Ordered    predniSONE (DELTASONE) 10 MG tablet        08/07/22 0753    HYDROcodone-acetaminophen (NORCO/VICODIN) 5-325 MG tablet  Every 6 hours PRN        08/07/22 0753             Note:  This document was prepared using Dragon voice recognition software and may include unintentional dictation errors.   [name], PA-C 08/07/22 1456    Delman Kitten, MD 08/07/22 [phone]

## 2022-08-07 NOTE — ED Triage Notes (Signed)
Pt presents to ER with right foot pain. "I think I may have rupture my achilles tendon". Pt reports has been dealing with this pain since Friday. Pt reports he has taken tylenol, ibuprofen with no relief. Pt reports he has had tendonitis is the past, but today's pain is severe. Pt denies any injury to right foot. Pt talks in complete sentences

## 2022-08-11 ENCOUNTER — Encounter: Payer: Self-pay | Admitting: Podiatry

## 2022-08-11 ENCOUNTER — Ambulatory Visit: Payer: Commercial Managed Care - PPO | Admitting: Podiatry

## 2022-08-11 DIAGNOSIS — M7672 Peroneal tendinitis, left leg: Secondary | ICD-10-CM | POA: Diagnosis not present

## 2022-08-11 MED ORDER — HYDROCODONE-ACETAMINOPHEN 5-325 MG PO TABS: 1.0000 | ORAL_TABLET | Freq: Four times a day (QID) | ORAL | 0 refills | Status: DC | PRN

## 2022-08-11 NOTE — Progress Notes (Signed)
Subjective:  Patient ID: Jonathan Sherman, male    DOB: [birth year],  MRN: [id number]  Chief Complaint  Patient presents with   Plantar Fasciitis    Pt stated that he went to the hospital on 3/17 had xrays done and is still having a lot of pain     37 y.o. male presents with the above complaint.  Patient presents with right lateral foot.  Patient states she he went to the hospital 3 send had x-rays done and still having issues.  He states it hurts with ambulation worse with pressure he has not seen anyone else prior to seeing me for this.  He has not seen a foot specialist pain scale 7 out of 10.  He denies any other acute complaints   Review of Systems: Negative except as noted in the HPI. Denies N/V/F/Ch.  No past medical history on file.  Current Outpatient Medications:    gabapentin (NEURONTIN) 300 MG capsule, Take by mouth., Disp: , Rfl:    HYDROcodone-acetaminophen (NORCO) 5-325 MG tablet, Take 1 tablet by mouth every 6 (six) hours as needed for moderate pain., Disp: 30 tablet, Rfl: 0   tiZANidine (ZANAFLEX) 4 MG tablet, Take by mouth., Disp: , Rfl:    traMADol (ULTRAM) 50 MG tablet, Take by mouth., Disp: , Rfl:    acetaminophen (TYLENOL) 325 MG tablet, Take by mouth., Disp: , Rfl:    hydrochlorothiazide (HYDRODIURIL) 25 MG tablet, Take by mouth., Disp: , Rfl:    HYDROcodone-acetaminophen (NORCO/VICODIN) 5-325 MG tablet, Take 1 tablet by mouth every 6 (six) hours as needed., Disp: 15 tablet, Rfl: 0   ibuprofen (ADVIL) 200 MG tablet, Take by mouth., Disp: , Rfl:    predniSONE (DELTASONE) 10 MG tablet, Take 6 tablets  today, on day 2 take 5 tablets, day 3 take 4 tablets, day 4 take 3 tablets, day 5 take  2 tablets and 1 tablet the last day, Disp: 21 tablet, Rfl: 0  Social History   Tobacco Use  Smoking Status Never  Smokeless Tobacco Never    No Known Allergies Objective:  There were no vitals filed for this visit. There is no height or weight on file to calculate  BMI. Constitutional Well developed. Well nourished.  Vascular Dorsalis pedis pulses palpable bilaterally. Posterior tibial pulses palpable bilaterally. Capillary refill normal to all digits.  No cyanosis or clubbing noted. Pedal hair growth normal.  Neurologic Normal speech. Oriented to person, place, and time. Epicritic sensation to light touch grossly present bilaterally.  Dermatologic Nails well groomed and normal in appearance. No open wounds. No skin lesions.  Orthopedic: Pain to palpation along the course of the peroneal tendon no pain at the insertion pain with resisted dorsiflexion eversion of the foot no pain with plantarflexion inversion of the foot.  No pain at the Achilles tendon posterior tibial tendon ATFL ligament   Radiographs:.  X-ray shows no diffuse bony abnormality.  Diffuse edema noted to the lower extremity but no fracture Assessment:   1. Peroneal tendinitis, left    Plan:  Patient was evaluated and treated and all questions answered.  Right peroneal tendinitis -All questions and concerns were discussed with the patient in extensive detail given the amount of pain that he is having he will benefit from cam boot immobilization he already has cam boot at home and he will apply that directly he will wear that for next 4 weeks.  Will plan on doing transition to the next medical visit  No follow-ups on file.

## 2022-08-12 ENCOUNTER — Telehealth: Payer: Self-pay | Admitting: *Deleted

## 2022-08-12 NOTE — Telephone Encounter (Signed)
Patient is calling for the status of a medication that was supposed to be sent to pharmacy, explained that the prescription was sent already and to contact them ,verbalized understanding, will call back if not there.

## 2022-08-12 NOTE — Telephone Encounter (Signed)
Denise(HR)-patient's employer is calling to ask how long can the patient stand or walk during his shift,did receive a doctor's note about wearing the boot for 4 weeks. Please advise.

## 2022-08-16 NOTE — Telephone Encounter (Signed)
Called and spoke with Denise(HR) giving doctor's instructions for wearing his boot while walking and standing,verbalized understanding.

## 2022-08-23 ENCOUNTER — Other Ambulatory Visit (INDEPENDENT_AMBULATORY_CARE_PROVIDER_SITE_OTHER): Payer: Commercial Managed Care - PPO | Admitting: Podiatry

## 2022-08-23 ENCOUNTER — Telehealth: Payer: Self-pay | Admitting: *Deleted

## 2022-08-23 DIAGNOSIS — M7672 Peroneal tendinitis, left leg: Secondary | ICD-10-CM

## 2022-08-23 MED ORDER — METHYLPREDNISOLONE 4 MG PO TBPK: ORAL_TABLET | ORAL | 0 refills | Status: DC

## 2022-08-23 NOTE — Telephone Encounter (Signed)
Patient is calling because his foot has gotten worse , not better, wearing boot but not helping, pain medicine is not helping much. He has been out of work for the past 2 days because of the pain, would like a work note to be excuse for those days,please advise.

## 2022-08-23 NOTE — Progress Notes (Signed)
Methylprednisolone rx sent per pt request

## 2022-08-23 NOTE — Telephone Encounter (Signed)
Patient called back and stated he is still in pain. It feel like a sharp stabbing pain. He wanted to know if he can get another round of prednisone.please advise.

## 2022-08-23 NOTE — Telephone Encounter (Signed)
Patient called back and stated he is still in pain. It feel like a sharp stabbing pain. He wanted to know if he can get another round of prednisone.  Please advise

## 2022-08-23 NOTE — Telephone Encounter (Signed)
Letter has been approved , written and patient updated,will pick up from [place] location.

## 2022-08-24 NOTE — Telephone Encounter (Signed)
Patient has been updated that medication has been sent to pharmacy on file thru voice message. 

## 2022-09-16 ENCOUNTER — Encounter: Payer: Self-pay | Admitting: Podiatry

## 2022-09-16 ENCOUNTER — Ambulatory Visit: Payer: Commercial Managed Care - PPO | Admitting: Podiatry

## 2022-09-16 DIAGNOSIS — M7671 Peroneal tendinitis, right leg: Secondary | ICD-10-CM | POA: Diagnosis not present

## 2022-09-16 NOTE — Progress Notes (Signed)
Subjective:  Patient ID: Jonathan Sherman, male    DOB: 1986/03/03,  MRN: [id number]  Chief Complaint  Patient presents with   Plantar Fasciitis    Pt stated that he went to the hospital on 3/17 had xrays done and is still having a lot of pain     37 y.o. male presents with the above complaint.  Patient presents for follow-up of right lateral peroneal tendinitis.  He states is doing better the cam boot helped some but he still has some residual pain would like to discuss treatment options for it.   Review of Systems: Negative except as noted in the HPI. Denies N/V/F/Ch.  No past medical history on file.  Current Outpatient Medications:    gabapentin (NEURONTIN) 300 MG capsule, Take by mouth., Disp: , Rfl:    HYDROcodone-acetaminophen (NORCO) 5-325 MG tablet, Take 1 tablet by mouth every 6 (six) hours as needed for moderate pain., Disp: 30 tablet, Rfl: 0   tiZANidine (ZANAFLEX) 4 MG tablet, Take by mouth., Disp: , Rfl:    traMADol (ULTRAM) 50 MG tablet, Take by mouth., Disp: , Rfl:    acetaminophen (TYLENOL) 325 MG tablet, Take by mouth., Disp: , Rfl:    hydrochlorothiazide (HYDRODIURIL) 25 MG tablet, Take by mouth., Disp: , Rfl:    HYDROcodone-acetaminophen (NORCO/VICODIN) 5-325 MG tablet, Take 1 tablet by mouth every 6 (six) hours as needed., Disp: 15 tablet, Rfl: 0   ibuprofen (ADVIL) 200 MG tablet, Take by mouth., Disp: , Rfl:    predniSONE (DELTASONE) 10 MG tablet, Take 6 tablets  today, on day 2 take 5 tablets, day 3 take 4 tablets, day 4 take 3 tablets, day 5 take  2 tablets and 1 tablet the last day, Disp: 21 tablet, Rfl: 0  Social History   Tobacco Use  Smoking Status Never  Smokeless Tobacco Never    No Known Allergies Objective:  There were no vitals filed for this visit. There is no height or weight on file to calculate BMI. Constitutional Well developed. Well nourished.  Vascular Dorsalis pedis pulses palpable bilaterally. Posterior tibial pulses palpable  bilaterally. Capillary refill normal to all digits.  No cyanosis or clubbing noted. Pedal hair growth normal.  Neurologic Normal speech. Oriented to person, place, and time. Epicritic sensation to light touch grossly present bilaterally.  Dermatologic Nails well groomed and normal in appearance. No open wounds. No skin lesions.  Orthopedic: Pain to palpation along the course of the peroneal tendon no pain at the insertion pain with resisted dorsiflexion eversion of the foot no pain with plantarflexion inversion of the foot.  No pain at the Achilles tendon posterior tibial tendon ATFL ligament   Radiographs:.  X-ray shows no diffuse bony abnormality.  Diffuse edema noted to the lower extremity but no fracture Assessment:   1. Peroneal tendinitis, left    Plan:  Patient was evaluated and treated and all questions answered.  Right peroneal tendinitis -All questions and concerns were discussed with the patient in extensive detail clinically the boot gave him some relief.  He still has some residual pain Given the amount of pain that he is having he will benefit from a steroid injection to help decrease inflammatory component associate with pain.  I discussed the risk of rupture associate with a states understanding like to proceed despite the risk -A steroid injection was performed at right lateral foot at point of maximal tenderness using 1% plain Lidocaine and 10 mg of Kenalog. This was well tolerated.  No follow-ups on file.

## 2022-10-18 ENCOUNTER — Ambulatory Visit: Payer: Commercial Managed Care - PPO | Admitting: Podiatry

## 2023-02-04 NOTE — Patient Instructions (Incomplete)

## 2023-02-07 ENCOUNTER — Encounter: Payer: Self-pay | Admitting: Nurse Practitioner

## 2023-02-07 ENCOUNTER — Other Ambulatory Visit
Admission: RE | Admit: 2023-02-07 | Discharge: 2023-02-07 | Disposition: A | Discharge status: Home / Self Care | Payer: Commercial Managed Care - PPO | Source: Ambulatory Visit | Attending: Nurse Practitioner | Admitting: Nurse Practitioner

## 2023-02-07 ENCOUNTER — Ambulatory Visit: Payer: No Typology Code available for payment source | Admitting: Nurse Practitioner

## 2023-02-07 VITALS — BP 136/88 | HR 89 | Temp 98.2°F | Ht 72.5 in | Wt >= 6400 oz

## 2023-02-07 DIAGNOSIS — R030 Elevated blood-pressure reading, without diagnosis of hypertension: Secondary | ICD-10-CM | POA: Insufficient documentation

## 2023-02-07 DIAGNOSIS — Z23 Encounter for immunization: Secondary | ICD-10-CM | POA: Diagnosis not present

## 2023-02-07 DIAGNOSIS — Z6841 Body Mass Index (BMI) 40.0 and over, adult: Secondary | ICD-10-CM | POA: Insufficient documentation

## 2023-02-07 DIAGNOSIS — Z114 Encounter for screening for human immunodeficiency virus [HIV]: Secondary | ICD-10-CM | POA: Diagnosis present

## 2023-02-07 DIAGNOSIS — Z1159 Encounter for screening for other viral diseases: Secondary | ICD-10-CM | POA: Diagnosis present

## 2023-02-07 DIAGNOSIS — Z833 Family history of diabetes mellitus: Secondary | ICD-10-CM | POA: Insufficient documentation

## 2023-02-07 DIAGNOSIS — Z808 Family history of malignant neoplasm of other organs or systems: Secondary | ICD-10-CM | POA: Insufficient documentation

## 2023-02-07 DIAGNOSIS — G4733 Obstructive sleep apnea (adult) (pediatric): Secondary | ICD-10-CM | POA: Diagnosis not present

## 2023-02-07 DIAGNOSIS — Z136 Encounter for screening for cardiovascular disorders: Secondary | ICD-10-CM

## 2023-02-07 DIAGNOSIS — Z13220 Encounter for screening for lipoid disorders: Secondary | ICD-10-CM

## 2023-02-07 DIAGNOSIS — I10 Essential (primary) hypertension: Secondary | ICD-10-CM | POA: Insufficient documentation

## 2023-02-07 LAB — CBC WITH DIFFERENTIAL/PLATELET
Abs Immature Granulocytes: 0.03 10*3/uL (ref 0.00–0.07)
Basophils Absolute: 0 10*3/uL (ref 0.0–0.1)
Basophils Relative: 0 %
Eosinophils Absolute: 0.1 10*3/uL (ref 0.0–0.5)
Eosinophils Relative: 2 %
HCT: 41.8 % (ref 39.0–52.0)
Hemoglobin: 13.8 g/dL (ref 13.0–17.0)
Immature Granulocytes: 0 %
Lymphocytes Relative: 26 %
Lymphs Abs: 1.9 10*3/uL (ref 0.7–4.0)
MCH: 28.5 pg (ref 26.0–34.0)
MCHC: 33 g/dL (ref 30.0–36.0)
MCV: 86.2 fL (ref 80.0–100.0)
Monocytes Absolute: 0.4 10*3/uL (ref 0.1–1.0)
Monocytes Relative: 5 %
Neutro Abs: 4.8 10*3/uL (ref 1.7–7.7)
Neutrophils Relative %: 67 %
Platelets: 217 10*3/uL (ref 150–400)
RBC: 4.85 MIL/uL (ref 4.22–5.81)
RDW: 12.6 % (ref 11.5–15.5)
WBC: 7.3 10*3/uL (ref 4.0–10.5)
nRBC: 0 % (ref 0.0–0.2)

## 2023-02-07 LAB — TSH: TSH: 2.887 u[IU]/mL (ref 0.350–4.500)

## 2023-02-07 LAB — COMPREHENSIVE METABOLIC PANEL
ALT: 55 U/L — ABNORMAL HIGH (ref 0–44)
AST: 40 U/L (ref 15–41)
Albumin: 3.8 g/dL (ref 3.5–5.0)
Alkaline Phosphatase: 51 U/L (ref 38–126)
Anion gap: 9 (ref 5–15)
BUN: 11 mg/dL (ref 6–20)
CO2: 25 mmol/L (ref 22–32)
Calcium: 9.3 mg/dL (ref 8.9–10.3)
Chloride: 104 mmol/L (ref 98–111)
Creatinine, Ser: 0.76 mg/dL (ref 0.61–1.24)
GFR, Estimated: >60 mL/min (ref >60)
Glucose, Bld: 119 mg/dL — ABNORMAL HIGH (ref 70–99)
Potassium: 3.8 mmol/L (ref 3.5–5.1)
Sodium: 138 mmol/L (ref 135–145)
Total Bilirubin: 0.9 mg/dL (ref 0.3–1.2)
Total Protein: 7.7 g/dL (ref 6.5–8.1)

## 2023-02-07 LAB — HEPATITIS C ANTIBODY: HCV Ab: NONREACTIVE

## 2023-02-07 LAB — LIPID PANEL
Cholesterol: 170 mg/dL (ref 0–200)
HDL: 33 mg/dL — ABNORMAL LOW (ref >40)
LDL Cholesterol: 84 mg/dL (ref 0–99)
Total CHOL/HDL Ratio: 5.2 ratio
Triglycerides: 264 mg/dL — ABNORMAL HIGH (ref <150)
VLDL: 53 mg/dL — ABNORMAL HIGH (ref 0–40)

## 2023-02-07 LAB — HIV ANTIBODY (ROUTINE TESTING W REFLEX): HIV Screen 4th Generation wRfx: NONREACTIVE

## 2023-02-07 NOTE — Addendum Note (Signed)
Addended by: Lonell Face C on: 02/07/2023 12:39 PM   Modules accepted: Orders

## 2023-02-07 NOTE — Progress Notes (Signed)
New Patient Office Visit  Subjective    Patient ID: Jonathan Sherman, male    DOB: [birth year]  Age: 37 y.o. MRN: [id number]  CC:  Chief Complaint  Patient presents with   Hypertension   Diabetes   Obesity    HPI KHYLIL DEGNER presents for new patient visit to establish care.  Introduced to Publishing rights manager role and practice setting.  All questions answered.  Discussed provider/patient relationship and expectations.  Has not seen a primary care in recent years.  He is not aware of any chronic issues being present, although was told in the past his blood pressure was elevated.    In the past he has seen over 150 SBP on readings.  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  Does have family history of father passing from HF + grandfather had high BP.  Oldest brother and sister have HTN.   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 BP range:  Recurrent headaches: no Visual changes: no Palpitations: no Dyspnea: at baseline has a little SOB with activity Chest pain: no Lower extremity edema: no Dizzy/lightheaded: no  He would like screening for diabetes today.  Has concerns about this.  Both of his brothers have diabetes -- one Type 1 and the other Type 2 + sister is prediabetic (takes Ozempic).  Does endorse at baseline drinks a lot of fluid.  No polyphagia and polyuria.  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  Received cortisone shot and has brace to wear.    SLEEP APNEA Diagnosed 2 years ago and uses CPAP.  Uses every night. Was diagnosed as very severe he reports, >1000 episodes.  Had sleep study at [facility]. Sleep apnea status: controlled Duration: chronic Satisfied with current treatment?:  yes CPAP use:  yes Sleep quality with CPAP use: excellent Treament compliance:excellent compliance Last sleep study: > 2  years Treatments attempted: CPAP Wakes feeling refreshed:   occasionally Daytime hypersomnolence:  no Fatigue:  no Insomnia:  no Good sleep hygiene:  yes Difficulty falling asleep:  no Difficulty staying asleep:  no Snoring bothers bed partner:  no Observed apnea by bed partner: yes Obesity:  yes Hypertension: yes  Pulmonary hypertension:  no Coronary artery disease:  no   Some recent stressors recently with his son starting school -- normal every day stressors.  Being a parent and working.      02/07/2023    8:18 AM  Depression screen PHQ 2/9  Decreased Interest 2  Down, Depressed, Hopeless 3  PHQ - 2 Score 5  Altered sleeping 2  Tired, decreased energy 3  Change in appetite 0  Feeling bad or failure about yourself  3  Trouble concentrating 0  Moving slowly or fidgety/restless 1  Suicidal thoughts 0  PHQ-9 Score 14  Difficult doing work/chores Somewhat difficult       02/07/2023    8:19 AM  GAD 7 : Generalized Anxiety Score  Nervous, Anxious, on Edge 3  Control/stop worrying 3  Worry too much - different things 3  Trouble relaxing 1  Restless 2  Easily annoyed or irritable 3  Afraid - awful might happen 0  Total GAD 7 Score 15  Anxiety Difficulty Somewhat difficult   Outpatient Encounter Medications as of 02/07/2023  Medication  Sig   [DISCONTINUED] acetaminophen (TYLENOL) 325 MG tablet Take by mouth.   [DISCONTINUED] gabapentin (NEURONTIN) 300 MG capsule Take by mouth.   [DISCONTINUED] hydrochlorothiazide (HYDRODIURIL) 25 MG tablet Take by mouth.   [DISCONTINUED] HYDROcodone-acetaminophen (NORCO) 5-325 MG tablet Take 1 tablet by mouth every 6 (six) hours as needed for moderate pain.   [DISCONTINUED] HYDROcodone-acetaminophen (NORCO/VICODIN) 5-325 MG tablet Take 1 tablet by mouth every 6 (six) hours as needed.   [DISCONTINUED] ibuprofen (ADVIL) 200 MG tablet Take by mouth.   [DISCONTINUED] methylPREDNISolone (MEDROL DOSEPAK) 4 MG TBPK tablet Take as directed for 6  days   [DISCONTINUED] predniSONE (DELTASONE) 10 MG tablet Take 6 tablets  today, on day 2 take 5 tablets, day 3 take 4 tablets, day 4 take 3 tablets, day 5 take  2 tablets and 1 tablet the last day   [DISCONTINUED] tiZANidine (ZANAFLEX) 4 MG tablet Take by mouth.   [DISCONTINUED] traMADol (ULTRAM) 50 MG tablet Take by mouth.   No facility-administered encounter medications on file as of 02/07/2023.    History reviewed. No pertinent past medical history.  Past Surgical History:  Procedure Laterality Date   CHOLECYSTECTOMY     KNEE ARTHROSCOPY WITH ANTERIOR CRUCIATE LIGAMENT (ACL) REPAIR Left    TONSILLECTOMY      Family History  Problem Relation Age of Onset   Melanoma Mother    Heart disease Father    Hypertension Father    Heart attack Father    Hypertension Sister    Diabetes Brother    Diabetes Brother    COPD Maternal Grandmother    Heart disease Maternal Grandfather    Hypertension Maternal Grandfather    Lymphoma Maternal Grandfather     Social History   Socioeconomic History   Marital status: Married    Spouse name: Not on file   Number of children: 1   Years of education: Not on file   Highest education level: Not on file  Occupational History   Not on file  Tobacco Use   Smoking status: Never   Smokeless tobacco: Never  Vaping Use   Vaping status: Never Used  Substance and Sexual Activity   Alcohol use: Never   Drug use: Never   Sexual activity: Yes  Other Topics Concern   Not on file  Social History Narrative   Not on file   Social Determinants of Health   Financial Resource Strain: Low Risk  (02/07/2023)   Overall Financial Resource Strain (CARDIA)    Difficulty of Paying Living Expenses: Not very hard  Food Insecurity: No Food Insecurity (02/07/2023)   Hunger Vital Sign    Worried About Running Out of Food in the Last Year: Never true    Ran Out of Food in the Last Year: Never true  Transportation Needs: No Transportation Needs (02/07/2023)    PRAPARE - Administrator, Civil Service (Medical): No    Lack of Transportation (Non-Medical): No  Physical Activity: Inactive (02/07/2023)   Exercise Vital Sign    Days of Exercise per Week: 0 days    Minutes of Exercise per Session: 0 min  Stress: Stress Concern Present (02/07/2023)   [employer] of Occupational Health - Occupational Stress Questionnaire    Feeling of Stress : To some extent  Social Connections: Moderately Isolated (02/07/2023)   Social Connection and Isolation Panel [NHANES]    Frequency of Communication with Friends and Family: More than three times a week    Frequency of Social Gatherings with Friends  and Family: More than three times a week    Attends Religious Services: Never    Active Member of Clubs or Organizations: No    Attends Banker Meetings: Never    Marital Status: Married  Catering manager Violence: Not At Risk (02/07/2023)   Humiliation, Afraid, Rape, and Kick questionnaire    Fear of Current or Ex-Partner: No    Emotionally Abused: No    Physically Abused: No    Sexually Abused: No    Review of Systems  Constitutional:  Positive for malaise/fatigue (started 2nd job, so working more). Negative for fever and weight loss.  Respiratory:  Positive for shortness of breath (with activity). Negative for cough, sputum production and wheezing.   Cardiovascular:  Negative for chest pain, palpitations, orthopnea and leg swelling.  Gastrointestinal: Negative.   Neurological: Negative.   Endo/Heme/Allergies:  Negative for polydipsia.  Psychiatric/Behavioral:  Positive for depression. Negative for memory loss and suicidal ideas. The patient is nervous/anxious. The patient does not have insomnia.       Objective    BP 136/88   Pulse 89   Temp 98.2 F (36.8 C) (Oral)   Ht 6' 0.5" (1.842 m)   Wt (!) 400 lb 9.6 oz (181.7 kg)   SpO2 98%   BMI 53.58 kg/m   Physical Exam Vitals and nursing note reviewed.  Constitutional:       General: He is awake. He is not in acute distress.    Appearance: He is well-developed and well-groomed. He is not ill-appearing or toxic-appearing.  HENT:     Head: Normocephalic and atraumatic.     Right Ear: Hearing, tympanic membrane, ear canal and external ear normal. No drainage.     Left Ear: Hearing, tympanic membrane, ear canal and external ear normal. No drainage.     Nose: Nose normal.     Mouth/Throat:     Pharynx: Uvula midline.  Eyes:     General: Lids are normal.        Right eye: No discharge.        Left eye: No discharge.     Extraocular Movements: Extraocular movements intact.     Conjunctiva/sclera: Conjunctivae normal.     Pupils: Pupils are equal, round, and reactive to light.     Visual Fields: Right eye visual fields normal and left eye visual fields normal.  Neck:     Thyroid: No thyromegaly.     Vascular: No carotid bruit or JVD.     Trachea: Trachea normal.  Cardiovascular:     Rate and Rhythm: Normal rate and regular rhythm.     Heart sounds: Normal heart sounds, S1 normal and S2 normal. No murmur heard.    No gallop.  Pulmonary:     Effort: Pulmonary effort is normal. No accessory muscle usage or respiratory distress.     Breath sounds: Normal breath sounds.  Abdominal:     General: Bowel sounds are normal.     Palpations: Abdomen is soft. There is no hepatomegaly or splenomegaly.     Tenderness: There is no abdominal tenderness.  Musculoskeletal:        General: Normal range of motion.     Cervical back: Normal range of motion and neck supple.     Right lower leg: No edema.     Left lower leg: No edema.  Lymphadenopathy:     Head:     Right side of head: No submental, submandibular, tonsillar, preauricular or posterior auricular adenopathy.  Left side of head: No submental, submandibular, tonsillar, preauricular or posterior auricular adenopathy.     Cervical: No cervical adenopathy.  Skin:    General: Skin is warm and dry.     Capillary  Refill: Capillary refill takes less than 2 seconds.     Findings: No rash.  Neurological:     Mental Status: He is alert and oriented to person, place, and time.     Gait: Gait is intact.     Deep Tendon Reflexes: Reflexes are normal and symmetric.     Reflex Scores:      Brachioradialis reflexes are 2+ on the right side and 2+ on the left side.      Patellar reflexes are 2+ on the right side and 2+ on the left side. Psychiatric:        Attention and Perception: Attention normal.        Mood and Affect: Mood normal.        Speech: Speech normal.        Behavior: Behavior normal. Behavior is cooperative.        Thought Content: Thought content normal.        Cognition and Memory: Cognition normal.       Assessment & Plan:   Problem List Items Addressed This Visit       Respiratory   OSA on CPAP    Chronic, ongoing.  Consistently uses CPAP.  Recommend continued 100% use of this.        Other   Class 3 severe obesity due to excess calories without serious comorbidity with body mass index (BMI) of 50.0 to 59.9 in adult (HCC) - Primary    BMI 53.58.  Recommended eating smaller high protein, low fat meals more frequently and exercising 30 mins a day 5 times a week with a goal of 10-15lb weight loss in the next 3 months. Patient voiced their understanding and motivation to adhere to these recommendations.  Lab check today.       Relevant Orders   CBC with Differential/Platelet   TSH   Elevated BP without diagnosis of hypertension    BP overall stable today.  We will monitor closely.  Recommend he monitor BP at least a few mornings a week at home and document.  DASH diet at home.  Initiate medication as needed.  Labs today.  Return in 3 months for BP check.       Relevant Orders   CBC with Differential/Platelet   Comprehensive metabolic panel   Family history of diabetes mellitus    Both brothers and sister.  Will check A1c today and start medication as needed.       Relevant Orders   Comprehensive metabolic panel   HgB A1c   Family history of melanoma    Mother with history of this, has passed.  Referral to dermatology for patient to start annual skin exams.      Relevant Orders   Ambulatory referral to Dermatology   Other Visit Diagnoses     Encounter for lipid screening for cardiovascular disease       Lipid panel on labs today.   Relevant Orders   Lipid Panel w/o Chol/HDL Ratio   Need for hepatitis C screening test       Hep C screening on labs today per guidelines, discussed with patient.   Relevant Orders   Hepatitis C antibody   Encounter for screening for HIV       HIV screening on  labs today per guidelines, discussed with patient.   Relevant Orders   HIV Antibody (routine testing w rflx)   Need for Tdap vaccination       Tdap in office today, educated patient on this.   Relevant Orders   Tdap vaccine greater than or equal to 7yo IM       Return in about 3 months (around 05/09/2023) for BP check in office.   Marjie Skiff, NP

## 2023-02-07 NOTE — Assessment & Plan Note (Signed)
BP overall stable today.  We will monitor closely.  Recommend he monitor BP at least a few mornings a week at home and document.  DASH diet at home.  Initiate medication as needed.  Labs today.  Return in 3 months for BP check.

## 2023-02-07 NOTE — Assessment & Plan Note (Signed)
Mother with history of this, has passed.  Referral to dermatology for patient to start annual skin exams.

## 2023-02-07 NOTE — Assessment & Plan Note (Signed)
BMI 53.58.  Recommended eating smaller high protein, low fat meals more frequently and exercising 30 mins a day 5 times a week with a goal of 10-15lb weight loss in the next 3 months. Patient voiced their understanding and motivation to adhere to these recommendations.  Lab check today.

## 2023-02-07 NOTE — Addendum Note (Signed)
Addended by: Lonell Face C on: 02/07/2023 12:38 PM   Modules accepted: Orders

## 2023-02-07 NOTE — Assessment & Plan Note (Signed)
Both brothers and sister.  Will check A1c today and start medication as needed.

## 2023-02-07 NOTE — Assessment & Plan Note (Signed)
Chronic, ongoing.  Consistently uses CPAP.  Recommend continued 100% use of this.

## 2023-02-08 ENCOUNTER — Encounter: Payer: Self-pay | Admitting: Nurse Practitioner

## 2023-02-08 DIAGNOSIS — R7303 Prediabetes: Secondary | ICD-10-CM | POA: Insufficient documentation

## 2023-02-08 NOTE — Progress Notes (Signed)
Contacted via MyChart   Good afternoon Jonathan Sherman, it was so nice to meet you.  Your labs have returned and overall this are good with a couple exceptions to work on.   - Kidney function, creatinine and eGFR, is normal. Liver function, AST and ALT, shows very mild elevation in ALT we will monitor.  If any alcohol intake or Tylenol use try to cut back on this.  - Hep C and HIV are negative:) - Thyroid, TSH, and blood counts are normal. - Cholesterol panel overall fairly stable with LDL, bad cholesterol, under 100.  Your triglycerides are a little elevated.  Try cutting back on red meat and processed foods. - The A1c is the diabetes testing we talked about, this looks at your blood sugars over the past 3 months and turns the average into a number.  Your number is 6%, meaning you are prediabetic.  Any number 5.7 to 6.4 is considered prediabetes and any number 6.5 or greater is considered diabetes.   I would recommend heavy focus on decreasing foods high in sugar and your intake of things like bread products, pasta, and rice.  The American Diabetes Association online has a large amount of information on diet changes to make.  We will recheck this number in 6 months, or sooner if you start having any symptoms.  Any questions? Keep being amazing!!  Thank you for allowing me to participate in your care.  I appreciate you. Kindest regards, Danie Diehl

## 2023-02-09 ENCOUNTER — Encounter: Payer: Self-pay | Admitting: Nurse Practitioner

## 2023-03-01 NOTE — Telephone Encounter (Signed)
Called patient LVM for patient to call so that we may schedule appointment.

## 2023-03-20 ENCOUNTER — Encounter: Payer: Self-pay | Admitting: Nurse Practitioner

## 2023-03-21 NOTE — Telephone Encounter (Signed)
Called and scheduled on 03/27/2023 @ 3:20 pm.

## 2023-03-25 NOTE — Patient Instructions (Signed)
Hand Pain Hand pain can make it hard to do daily activities. Many things can cause hand pain. Some common causes are: Injuries. These may include: Broken bones (fractures)and cuts. Overuse injuries from doing the same movements many times (repetitive activity). Arthritis. Lumps in the tendons or joints of the hand and wrist (ganglion cysts). Nerve compression syndromes (carpal tunnel syndrome). Inflammation of the tendons (tendinitis). Infection. Follow these instructions at home: Managing pain, stiffness, and swelling     Take over-the-counter and prescription medicines only as told by your health care provider. If told, put ice on the affected area. Put ice in a plastic bag. Place a towel between your skin and the bag. Leave the ice on for 20 minutes, 2-3 times a day. If told, apply heat to the affected area before you exercise or as often as told by your provider. Use the heat source that your provider recommends, such as a moist heat pack or a heating pad. Place a towel between your skin and the heat source. Leave the heat on for 20-30 minutes. If your skin turns bright red, remove the ice or heat right away to prevent skin damage. The risk of damage is higher if you cannot feel pain, heat, or cold. Activity Take breaks from repetitive activity often. Minimize stress on your hands and wrists as much as possible. Do stretches or exercises as told by your provider. Do not do activities that make your pain worse. Wear a hand splint or support as told by your provider. Contact a health care provider if: Your pain does not get better after a few days. Your pain gets worse. Your pain affects your ability to do your daily activities. Your hand becomes warm, red, or swollen. Your hand is numb or tingling. Get help right away if: Your hand is extremely swollen or is an unusual shape. Your hand or fingers turn white or blue. You cannot move your hand, wrist, or fingers. This  information is not intended to replace advice given to you by your health care provider. Make sure you discuss any questions you have with your health care provider. Document Revised: 12/15/2021 Document Reviewed: 12/15/2021 Elsevier Patient Education  2024 [employer]  

## 2023-03-27 ENCOUNTER — Ambulatory Visit: Payer: No Typology Code available for payment source | Admitting: Nurse Practitioner

## 2023-03-27 ENCOUNTER — Encounter: Payer: Self-pay | Admitting: Nurse Practitioner

## 2023-03-27 VITALS — BP 143/96 | HR 99 | Temp 98.4°F | Ht 72.0 in

## 2023-03-27 DIAGNOSIS — R200 Anesthesia of skin: Secondary | ICD-10-CM | POA: Insufficient documentation

## 2023-03-27 DIAGNOSIS — H6501 Acute serous otitis media, right ear: Secondary | ICD-10-CM

## 2023-03-27 DIAGNOSIS — R030 Elevated blood-pressure reading, without diagnosis of hypertension: Secondary | ICD-10-CM | POA: Diagnosis not present

## 2023-03-27 DIAGNOSIS — H6690 Otitis media, unspecified, unspecified ear: Secondary | ICD-10-CM | POA: Insufficient documentation

## 2023-03-27 MED ORDER — AMOXICILLIN-POT CLAVULANATE 875-125 MG PO TABS: 1.0000 | ORAL_TABLET | Freq: Two times a day (BID) | ORAL | 0 refills | Status: AC

## 2023-03-27 NOTE — Progress Notes (Signed)
BP (!) 143/96 (BP Location: Right Arm, Patient Position: Sitting, Cuff Size: Large)   Pulse 99   Temp 98.4 F (36.9 C) (Oral)   Ht 6' (1.829 m)   SpO2 97%   BMI 54.33 kg/m    Subjective:    Patient ID: Jonathan Sherman, male    DOB: [birth year], 37 y.o.   MRN: [id number]  HPI: Jonathan Sherman is a 37 y.o. male  Chief Complaint  Patient presents with   Numbness    Numbness and tingling in bilateral hands, has been going on for years but has recently become worse   Pain    Bilateral hands and arms    Otitis Media    Right ear, has been a problem since this morning    Feels like he may have some low testosterone, after gall bladder removed he put on a lot of weight.  Can not get weight off.  Less libido and more fatigued.  NUMBNESS Bilateral hands and arms with tingling, has been present for over a year, but worse over past few weeks.  Wakes him up while sleeping.  When in driving motion or typing on computer hands go numb.  He is right handed. When sleeping it is his whole arm, but during day it is from elbow down.      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   Duration: months Onset: gradual Location: both arms Bilateral: yes Symmetric: yes Decreased sensation: yes in fingertips Weakness: yes Pain: yes Quality:  aching, burning, and throbbing, tingling, stabbing at night Severity: 8/10 at worst and at best 3/10  Frequency: constant Trauma: no Recent illness: no Diabetes: no A1c in September was 6% Thyroid disease: no  HIV: no  Alcoholism: no  Spinal cord injury: no Alleviating factors: nothing Aggravating factors: lying on one side, being still  Status: worse Treatments attempted: Magnesium, Folic Acid  EAR PAIN Started this morning.  Right ear.  Could be from grinding jaw with CPAP. When moves jaw he can feel it. Duration: days Involved ear(s): right Severity:  7/10  Quality:  dull, aching, and throbbing Fever: no Otorrhea: no Upper respiratory  infection symptoms: no Pruritus: yes Hearing loss: no Water immersion no Using Q-tips: yes Recurrent otitis media: no Status: stable Treatments attempted: none   Relevant past medical, surgical, family and social history reviewed and updated as indicated. Interim medical history since our last visit reviewed. Allergies and medications reviewed and updated.  Review of Systems  Constitutional:  Positive for fatigue. Negative for activity change, appetite change, chills, fever and unexpected weight change.  HENT:  Positive for ear pain. Negative for congestion, ear discharge, postnasal drip and rhinorrhea.   Respiratory:  Negative for cough, chest tightness, shortness of breath and wheezing.   Cardiovascular:  Negative for chest pain, palpitations and leg swelling.  Neurological:  Positive for weakness (bilateral hands) and numbness (bilateral arms). Negative for dizziness, facial asymmetry and headaches.  Psychiatric/Behavioral: Negative.     Per HPI unless specifically indicated above     Objective:    BP (!) 143/96 (BP Location: Right Arm, Patient Position: Sitting, Cuff Size: Large)   Pulse 99   Temp 98.4 F (36.9 C) (Oral)   Ht 6' (1.829 m)   SpO2 97%   BMI 54.33 kg/m   Wt Readings from Last 3 Encounters:  02/07/23 (!) 400 lb 9.6 oz (181.7 kg)  08/07/22 (!) 380 lb (172.4 kg)  09/01/21 (!) 370 lb (167.8 kg)  Physical Exam Vitals and nursing note reviewed.  Constitutional:      General: He is awake. He is not in acute distress.    Appearance: Normal appearance. He is well-developed and well-groomed. He is obese. He is not ill-appearing or toxic-appearing.  HENT:     Head: Normocephalic.     Right Ear: Hearing, ear canal and external ear normal. No middle ear effusion. There is no impacted cerumen. Tympanic membrane is injected. Tympanic membrane is not perforated.     Left Ear: Hearing, ear canal and external ear normal.  No middle ear effusion. There is no impacted  cerumen. Tympanic membrane is not injected or perforated.     Nose: Nose normal.     Right Sinus: No maxillary sinus tenderness or frontal sinus tenderness.     Left Sinus: No maxillary sinus tenderness or frontal sinus tenderness.     Mouth/Throat:     Mouth: Mucous membranes are moist.     Pharynx: No pharyngeal swelling, oropharyngeal exudate or posterior oropharyngeal erythema.  Eyes:     General: Lids are normal.     Extraocular Movements: Extraocular movements intact.     Conjunctiva/sclera: Conjunctivae normal.  Neck:     Thyroid: No thyromegaly.     Vascular: No carotid bruit.  Cardiovascular:     Rate and Rhythm: Normal rate and regular rhythm.     Heart sounds: Normal heart sounds. No murmur heard.    No gallop.  Pulmonary:     Effort: No accessory muscle usage or respiratory distress.     Breath sounds: Normal breath sounds.  Abdominal:     General: Bowel sounds are normal. There is no distension.     Palpations: Abdomen is soft.     Tenderness: There is no abdominal tenderness.  Musculoskeletal:     Right upper arm: Normal.     Left upper arm: Normal.     Right hand: No swelling. Normal range of motion. Normal strength. Decreased sensation. Normal capillary refill.     Left hand: No swelling. Normal range of motion. Normal strength. Decreased sensation. Normal capillary refill.     Cervical back: Full passive range of motion without pain.     Right lower leg: No edema.     Left lower leg: No edema.  Lymphadenopathy:     Cervical: No cervical adenopathy.  Skin:    General: Skin is warm.     Capillary Refill: Capillary refill takes less than 2 seconds.  Neurological:     Mental Status: He is alert and oriented to person, place, and time.     Cranial Nerves: Cranial nerves 2-12 are intact.     Sensory: Sensory deficit present.     Motor: No weakness.     Gait: Gait is intact.     Deep Tendon Reflexes: Reflexes are normal and symmetric.     Reflex Scores:       Brachioradialis reflexes are 2+ on the right side and 2+ on the left side.      Patellar reflexes are 2+ on the right side and 2+ on the left side.    Comments: Some reduced sensation present to both hands and fingertips, L>R. Arms with no sensation changes.  Psychiatric:        Attention and Perception: Attention normal.        Mood and Affect: Mood normal.        Speech: Speech normal.        Behavior: Behavior normal.  Behavior is cooperative.        Thought Content: Thought content normal.     Results for orders placed or performed during the hospital encounter of 02/07/23  CBC with Differential/Platelet  Result Value Ref Range   WBC 7.3 4.0 - 10.5 K/uL   RBC 4.85 4.22 - 5.81 MIL/uL   Hemoglobin 13.8 13.0 - 17.0 g/dL   HCT 84.1 32.4 - 40.1 %   MCV 86.2 80.0 - 100.0 fL   MCH 28.5 26.0 - 34.0 pg   MCHC 33.0 30.0 - 36.0 g/dL   RDW 02.7 25.3 - 66.4 %   Platelets 217 150 - 400 K/uL   nRBC 0.0 0.0 - 0.2 %   Neutrophils Relative % 67 %   Neutro Abs 4.8 1.7 - 7.7 K/uL   Lymphocytes Relative 26 %   Lymphs Abs 1.9 0.7 - 4.0 K/uL   Monocytes Relative 5 %   Monocytes Absolute 0.4 0.1 - 1.0 K/uL   Eosinophils Relative 2 %   Eosinophils Absolute 0.1 0.0 - 0.5 K/uL   Basophils Relative 0 %   Basophils Absolute 0.0 0.0 - 0.1 K/uL   Immature Granulocytes 0 %   Abs Immature Granulocytes 0.03 0.00 - 0.07 K/uL  Comprehensive metabolic panel  Result Value Ref Range   Sodium 138 135 - 145 mmol/L   Potassium 3.8 3.5 - 5.1 mmol/L   Chloride 104 98 - 111 mmol/L   CO2 25 22 - 32 mmol/L   Glucose, Bld 119 (H) 70 - 99 mg/dL   BUN 11 6 - 20 mg/dL   Creatinine, Ser 4.03 0.61 - 1.24 mg/dL   Calcium 9.3 8.9 - 47.4 mg/dL   Total Protein 7.7 6.5 - 8.1 g/dL   Albumin 3.8 3.5 - 5.0 g/dL   AST 40 15 - 41 U/L   ALT 55 (H) 0 - 44 U/L   Alkaline Phosphatase 51 38 - 126 U/L   Total Bilirubin 0.9 0.3 - 1.2 mg/dL   GFR, Estimated >25 >95 mL/min   Anion gap 9 5 - 15  Hepatitis C antibody  Result Value  Ref Range   HCV Ab NON REACTIVE NON REACTIVE  HgB A1c  Result Value Ref Range   Hgb A1c MFr Bld 6.0 (H) 4.8 - 5.6 %   Mean Plasma Glucose 126 mg/dL  HIV Antibody (routine testing w rflx)  Result Value Ref Range   HIV Screen 4th Generation wRfx Non Reactive Non Reactive  TSH  Result Value Ref Range   TSH 2.887 0.350 - 4.500 uIU/mL  Lipid panel  Result Value Ref Range   Cholesterol 170 0 - 200 mg/dL   Triglycerides 638 (H) <150 mg/dL   HDL 33 (L) >75 mg/dL   Total CHOL/HDL Ratio 5.2 RATIO   VLDL 53 (H) 0 - 40 mg/dL   LDL Cholesterol 84 0 - 99 mg/dL      Assessment & Plan:   Problem List Items Addressed This Visit       Nervous and Auditory   Otitis media    Acute to right ear.  TM intact.  Start Augmentin, educated patient on this medication and use.  Recommend avoid Q tips.  May take Tylenol as needed for pain.      Relevant Medications   amoxicillin-clavulanate (AUGMENTIN) 875-125 MG tablet     Other   Elevated BP without diagnosis of hypertension    BP elevated today due to pain.  We will monitor closely and recheck next visit.  Recommend he monitor BP at least a few mornings a week at home and document.  DASH diet at home.  Initiate medication as needed.        Numbness of arm - Primary    Ongoing to both arms/hands for over a year with worsening and weakness.  ?related to neck or carpal tunnel, possibly both.  At this time recommend continue Magnesium and may take Tylenol as needed for pain.  Referral to ortho for further assessment, suspect they will perform imaging - will not order today.  Appreciate their input.      Relevant Orders   Ambulatory referral to Orthopedic Surgery   Testosterone, free, total([facility])     Follow up plan: Return for as scheduled December 17th.

## 2023-03-27 NOTE — Assessment & Plan Note (Signed)
Ongoing to both arms/hands for over a year with worsening and weakness.  ?related to neck or carpal tunnel, possibly both.  At this time recommend continue Magnesium and may take Tylenol as needed for pain.  Referral to ortho for further assessment, suspect they will perform imaging - will not order today.  Appreciate their input.

## 2023-03-27 NOTE — Assessment & Plan Note (Signed)
Acute to right ear.  TM intact.  Start Augmentin, educated patient on this medication and use.  Recommend avoid Q tips.  May take Tylenol as needed for pain.

## 2023-03-27 NOTE — Assessment & Plan Note (Signed)
BP elevated today due to pain.  We will monitor closely and recheck next visit.  Recommend he monitor BP at least a few mornings a week at home and document.  DASH diet at home.  Initiate medication as needed.

## 2023-03-30 ENCOUNTER — Ambulatory Visit: Payer: Self-pay

## 2023-03-30 NOTE — Telephone Encounter (Signed)
Summary: possible kidney stone, no appt   Jonathan Sherman has significant back pain (not severe) he believes this could be another kidney stone. Wants appt today, nothing available.       Chief Complaint: Severe lower left back pain."It feels like when I  had kidney stone." Constant, pain 9/10. Symptoms: Pain Frequency: Today Pertinent Negatives: Patient denies any other symptoms Disposition: [] ED /[x] Urgent Care (no appt availability in office) / [] Appointment(In office/virtual)/ []  [facility]/ [] Home Care/ [] Refused Recommended Disposition /[] [facility]/ []  Follow-up with PCP Additional Notes: Jonathan Sherman. Agrees with UC. "I can't wait to be seen, I'm going today."  Reason for Disposition  [1] SEVERE back pain (e.g., excruciating, unable to do any normal activities) AND [2] not improved 2 hours after pain medicine  Answer Assessment - Initial Assessment Questions 1. ONSET: "When did the pain begin?"      Today 2. LOCATION: "Where does it hurt?" (upper, mid or lower back)     Lower left 3. SEVERITY: "How bad is the pain?"  (e.g., Scale 1-10; mild, moderate, or severe)   - MILD (1-3): Doesn't interfere with normal activities.    - MODERATE (4-7): Interferes with normal activities or awakens from sleep.    - SEVERE (8-10): Excruciating pain, unable to do any normal activities.      Constant - 9 4. PATTERN: "Is the pain constant?" (e.g., yes, no; constant, intermittent)      Constant 5. RADIATION: "Does the pain shoot into your legs or somewhere else?"     No 6. CAUSE:  "What do you think is causing the back pain?"      Kidney stone 7. BACK OVERUSE:  "Any recent lifting of heavy objects, strenuous work or exercise?"     No 8. MEDICINES: "What have you taken so far for the pain?" (e.g., nothing, acetaminophen, NSAIDS)     OTC 9. NEUROLOGIC SYMPTOMS: "Do you have any weakness, numbness, or problems with bowel/bladder control?"     No 10. OTHER SYMPTOMS: "Do you have any other  symptoms?" (e.g., fever, abdomen pain, burning with urination, blood in urine)       No 11. PREGNANCY: "Is there any chance you are pregnant?" "When was your last menstrual period?"       N/a  Protocols used: Back Pain-A-AH

## 2023-05-06 NOTE — Patient Instructions (Signed)
Be Involved in Caring For Your Health:  Taking Medications When medications are taken as directed, they can greatly improve your health. But if they are not taken as prescribed, they may not work. In some cases, not taking them correctly can be harmful. To help ensure your treatment remains effective and safe, understand your medications and how to take them. Bring your medications to each visit for review by your provider.  Your lab results, notes, and after visit summary will be available on My Chart. We strongly encourage you to use this feature. If lab results are abnormal the clinic will contact you with the appropriate steps. If the clinic does not contact you assume the results are satisfactory. You can always view your results on My Chart. If you have questions regarding your health or results, please contact the clinic during office hours. You can also ask questions on My Chart.  We at [facility] are grateful that you chose us to provide your care. We strive to provide evidence-based and compassionate care and are always looking for feedback. If you get a survey from the clinic please complete this so we can hear your opinions.  DASH Eating Plan DASH stands for Dietary Approaches to Stop Hypertension. The DASH eating plan is a healthy eating plan that has been shown to: Lower high blood pressure (hypertension). Reduce your risk for type 2 diabetes, heart disease, and stroke. Help with weight loss. What are tips for following this plan? Reading food labels Check food labels for the amount of salt (sodium) per serving. Choose foods with less than 5 percent of the Daily Value (DV) of sodium. In general, foods with less than 300 milligrams (mg) of sodium per serving fit into this eating plan. To find whole grains, look for the word "whole" as the first word in the ingredient list. Shopping Buy products labeled as "low-sodium" or "no salt added." Buy fresh foods. Avoid canned  foods and pre-made or frozen meals. Cooking Try not to add salt when you cook. Use salt-free seasonings or herbs instead of table salt or sea salt. Check with your health care provider or pharmacist before using salt substitutes. Do not fry foods. Cook foods in healthy ways, such as baking, boiling, grilling, roasting, or broiling. Cook using oils that are good for your heart. These include olive, canola, avocado, soybean, and sunflower oil. Meal planning  Eat a balanced diet. This should include: 4 or more servings of fruits and 4 or more servings of vegetables each day. Try to fill half of your plate with fruits and vegetables. 6-8 servings of whole grains each day. 6 or less servings of lean meat, poultry, or fish each day. 1 oz is 1 serving. A 3 oz (85 g) serving of meat is about the same size as the palm of your hand. One egg is 1 oz (28 g). 2-3 servings of low-fat dairy each day. One serving is 1 cup (237 mL). 1 serving of nuts, seeds, or beans 5 times each week. 2-3 servings of heart-healthy fats. Healthy fats called omega-3 fatty acids are found in foods such as walnuts, flaxseeds, fortified milks, and eggs. These fats are also found in cold-water fish, such as sardines, salmon, and mackerel. Limit how much you eat of: Canned or prepackaged foods. Food that is high in trans fat, such as fried foods. Food that is high in saturated fat, such as fatty meat. Desserts and other sweets, sugary drinks, and other foods with added sugar. Full-fat   dairy products. Do not salt foods before eating. Do not eat more than 4 egg yolks a week. Try to eat at least 2 vegetarian meals a week. Eat more home-cooked food and less restaurant, buffet, and fast food. Lifestyle When eating at a restaurant, ask if your food can be made with less salt or no salt. If you drink alcohol: Limit how much you have to: 0-1 drink a day if you are male. 0-2 drinks a day if you are male. Know how much alcohol is in  your drink. In the [place] one drink is one 12 oz bottle of beer (355 mL), one 5 oz glass of wine (148 mL), or one 1 oz glass of hard liquor (44 mL). General information Avoid eating more than 2,300 mg of salt a day. If you have hypertension, you may need to reduce your sodium intake to 1,500 mg a day. Work with your provider to stay at a healthy body weight or lose weight. Ask what the best weight range is for you. On most days of the week, get at least 30 minutes of exercise that causes your heart to beat faster. This may include walking, swimming, or biking. Work with your provider or dietitian to adjust your eating plan to meet your specific calorie needs. What foods should I eat? Fruits All fresh, dried, or frozen fruit. Canned fruits that are in their natural juice and do not have sugar added to them. Vegetables Fresh or frozen vegetables that are raw, steamed, roasted, or grilled. Low-sodium or reduced-sodium tomato and vegetable juice. Low-sodium or reduced-sodium tomato sauce and tomato paste. Low-sodium or reduced-sodium canned vegetables. Grains Whole-grain or whole-wheat bread. Whole-grain or whole-wheat pasta. Brown rice. Oatmeal. Quinoa. Bulgur. Whole-grain and low-sodium cereals. Pita bread. Low-fat, low-sodium crackers. Whole-wheat flour tortillas. Meats and other proteins Skinless chicken or turkey. Ground chicken or turkey. Pork with fat trimmed off. Fish and seafood. Egg whites. Dried beans, peas, or lentils. Unsalted nuts, nut butters, and seeds. Unsalted canned beans. Lean cuts of beef with fat trimmed off. Low-sodium, lean precooked or cured meat, such as sausages or meat loaves. Dairy Low-fat (1%) or fat-free (skim) milk. Reduced-fat, low-fat, or fat-free cheeses. Nonfat, low-sodium ricotta or cottage cheese. Low-fat or nonfat yogurt. Low-fat, low-sodium cheese. Fats and oils Soft margarine without trans fats. Vegetable oil. Reduced-fat, low-fat, or light mayonnaise and salad  dressings (reduced-sodium). Canola, safflower, olive, avocado, soybean, and sunflower oils. Avocado. Seasonings and condiments Herbs. Spices. Seasoning mixes without salt. Other foods Unsalted popcorn and pretzels. Fat-free sweets. The items listed above may not be all the foods and drinks you can have. Talk to a dietitian to learn more. What foods should I avoid? Fruits Canned fruit in a light or heavy syrup. Fried fruit. Fruit in cream or butter sauce. Vegetables Creamed or fried vegetables. Vegetables in a cheese sauce. Regular canned vegetables that are not marked as low-sodium or reduced-sodium. Regular canned tomato sauce and paste that are not marked as low-sodium or reduced-sodium. Regular tomato and vegetable juices that are not marked as low-sodium or reduced-sodium. Pickles. Olives. Grains Baked goods made with fat, such as croissants, muffins, or some breads. Dry pasta or rice meal packs. Meats and other proteins Fatty cuts of meat. Ribs. Fried meat. Bacon. Bologna, salami, and other precooked or cured meats, such as sausages or meat loaves, that are not lean and low in sodium. Fat from the back of a pig (fatback). Bratwurst. Salted nuts and seeds. Canned beans with added salt. Canned   or smoked fish. Whole eggs or egg yolks. Chicken or turkey with skin. Dairy Whole or 2% milk, cream, and half-and-half. Whole or full-fat cream cheese. Whole-fat or sweetened yogurt. Full-fat cheese. Nondairy creamers. Whipped toppings. Processed cheese and cheese spreads. Fats and oils Butter. Stick margarine. Lard. Shortening. Ghee. Bacon fat. Tropical oils, such as coconut, palm kernel, or palm oil. Seasonings and condiments Onion salt, garlic salt, seasoned salt, table salt, and sea salt. Worcestershire sauce. Tartar sauce. Barbecue sauce. Teriyaki sauce. Soy sauce, including reduced-sodium soy sauce. Steak sauce. Canned and packaged gravies. Fish sauce. Oyster sauce. Cocktail sauce. Store-bought  horseradish. Ketchup. Mustard. Meat flavorings and tenderizers. Bouillon cubes. Hot sauces. Pre-made or packaged marinades. Pre-made or packaged taco seasonings. Relishes. Regular salad dressings. Other foods Salted popcorn and pretzels. The items listed above may not be all the foods and drinks you should avoid. Talk to a dietitian to learn more. Where to find more information National Heart, Lung, and Blood Institute (NHLBI): nhlbi.nih.gov American Heart Association (AHA): heart.org Academy of Nutrition and Dietetics: eatright.org National Kidney Foundation (NKF): kidney.org This information is not intended to replace advice given to you by your health care provider. Make sure you discuss any questions you have with your health care provider. Document Revised: 05/26/2022 Document Reviewed: 05/26/2022 Elsevier Patient Education  2024 Elsevier Inc.  

## 2023-05-09 ENCOUNTER — Ambulatory Visit: Payer: No Typology Code available for payment source | Admitting: Nurse Practitioner

## 2023-05-09 ENCOUNTER — Encounter: Payer: Self-pay | Admitting: Nurse Practitioner

## 2023-05-09 VITALS — BP 133/85 | HR 81 | Temp 98.9°F | Resp 17 | Wt >= 6400 oz

## 2023-05-09 DIAGNOSIS — Z6841 Body Mass Index (BMI) 40.0 and over, adult: Secondary | ICD-10-CM | POA: Diagnosis not present

## 2023-05-09 DIAGNOSIS — I10 Essential (primary) hypertension: Secondary | ICD-10-CM

## 2023-05-09 DIAGNOSIS — R200 Anesthesia of skin: Secondary | ICD-10-CM

## 2023-05-09 DIAGNOSIS — E66813 Obesity, class 3: Secondary | ICD-10-CM | POA: Diagnosis not present

## 2023-05-09 MED ORDER — OLMESARTAN MEDOXOMIL 20 MG PO TABS: 10.0000 mg | ORAL_TABLET | Freq: Every day | ORAL | 2 refills | Status: DC

## 2023-05-09 NOTE — Assessment & Plan Note (Signed)
Ongoing elevations >130/80.  Discussed with patient benefit of starting medication which he agrees with.  Will start Olmesartan 10 MG daily and adjust as needed.  Educated him on ARB family and side effects. Recommend he monitor BP at least a few mornings a week at home and document.  DASH diet at home.  Labs today: obtain next visit.  Return in 4 weeks.

## 2023-05-09 NOTE — Assessment & Plan Note (Signed)
Ongoing, is attending neurology visit in new year for further testing.

## 2023-05-09 NOTE — Assessment & Plan Note (Signed)
BMI 54.58.  Recommended eating smaller high protein, low fat meals more frequently and exercising 30 mins a day 5 times a week with a goal of 10-15lb weight loss in the next 3 months. Patient voiced their understanding and motivation to adhere to these recommendations.  Lab check today.

## 2023-05-09 NOTE — Progress Notes (Signed)
BP 133/85 (BP Location: Right Wrist, Cuff Size: Normal)   Pulse 81   Temp 98.9 F (37.2 C) (Oral)   Resp 17   Wt (!) 402 lb 6.4 oz (182.5 kg)   SpO2 96%   BMI 54.58 kg/m    Subjective:    Patient ID: Jonathan Sherman, male    DOB: 09/08/1985, 37 y.o.   MRN: [id number]  HPI: Jonathan Sherman is a 37 y.o. male  Chief Complaint  Patient presents with   Blood pressure    Says it continues to run high at appointments but unable to check at home.    Numbness    Appointment with neuro for b/l hands not sure what its from   HYPERTENSION without Chronic Kidney Disease Has never taken medication for  this.  Is going to neurology to further assess for carpal tunnel. Saw ortho who recommended further assessment with neuro. Hypertension status: stable  BP monitoring frequency:  not checking BP range:  BP medication side effects:  no Previous BP meds: none Aspirin: no Recurrent headaches: no Visual changes: no Palpitations: no Dyspnea: no Chest pain: no Lower extremity edema: no Dizzy/lightheaded: no   Relevant past medical, surgical, family and social history reviewed and updated as indicated. Interim medical history since our last visit reviewed. Allergies and medications reviewed and updated.  Review of Systems  Constitutional:  Negative for activity change, diaphoresis, fatigue and fever.  Respiratory:  Negative for cough, chest tightness, shortness of breath and wheezing.   Cardiovascular:  Negative for chest pain, palpitations and leg swelling.  Gastrointestinal: Negative.   Endocrine: Negative for polydipsia, polyphagia and polyuria.  Neurological:  Positive for numbness.  Psychiatric/Behavioral: Negative.     Per HPI unless specifically indicated above     Objective:    BP 133/85 (BP Location: Right Wrist, Cuff Size: Normal)   Pulse 81   Temp 98.9 F (37.2 C) (Oral)   Resp 17   Wt (!) 402 lb 6.4 oz (182.5 kg)   SpO2 96%   BMI 54.58 kg/m   Wt Readings from Last  3 Encounters:  05/09/23 (!) 402 lb 6.4 oz (182.5 kg)  02/07/23 (!) 400 lb 9.6 oz (181.7 kg)  08/07/22 (!) 380 lb (172.4 kg)    Physical Exam Vitals and nursing note reviewed.  Constitutional:      General: He is awake. He is not in acute distress.    Appearance: Normal appearance. He is well-developed and well-groomed. He is obese. He is not ill-appearing or toxic-appearing.  HENT:     Head: Normocephalic.     Right Ear: Hearing and external ear normal.     Left Ear: Hearing and external ear normal.  Eyes:     General: Lids are normal.     Extraocular Movements: Extraocular movements intact.     Conjunctiva/sclera: Conjunctivae normal.  Neck:     Thyroid: No thyromegaly.     Vascular: No carotid bruit.  Cardiovascular:     Rate and Rhythm: Normal rate and regular rhythm.     Heart sounds: Normal heart sounds. No murmur heard.    No gallop.  Pulmonary:     Effort: No accessory muscle usage or respiratory distress.     Breath sounds: Normal breath sounds.  Abdominal:     General: Bowel sounds are normal. There is no distension.     Palpations: Abdomen is soft.     Tenderness: There is no abdominal tenderness.  Musculoskeletal:  Cervical back: Full passive range of motion without pain.     Right lower leg: No edema.     Left lower leg: No edema.  Lymphadenopathy:     Cervical: No cervical adenopathy.  Skin:    General: Skin is warm.     Capillary Refill: Capillary refill takes less than 2 seconds.  Neurological:     Mental Status: He is alert and oriented to person, place, and time.     Deep Tendon Reflexes: Reflexes are normal and symmetric.     Reflex Scores:      Brachioradialis reflexes are 2+ on the right side and 2+ on the left side.      Patellar reflexes are 2+ on the right side and 2+ on the left side. Psychiatric:        Attention and Perception: Attention normal.        Mood and Affect: Mood normal.        Speech: Speech normal.        Behavior: Behavior  normal. Behavior is cooperative.        Thought Content: Thought content normal.    Results for orders placed or performed during the hospital encounter of 02/07/23  CBC with Differential/Platelet   Collection Time: 02/07/23 12:55 PM  Result Value Ref Range   WBC 7.3 4.0 - 10.5 K/uL   RBC 4.85 4.22 - 5.81 MIL/uL   Hemoglobin 13.8 13.0 - 17.0 g/dL   HCT 40.9 81.1 - 91.4 %   MCV 86.2 80.0 - 100.0 fL   MCH 28.5 26.0 - 34.0 pg   MCHC 33.0 30.0 - 36.0 g/dL   RDW 78.2 95.6 - 21.3 %   Platelets 217 150 - 400 K/uL   nRBC 0.0 0.0 - 0.2 %   Neutrophils Relative % 67 %   Neutro Abs 4.8 1.7 - 7.7 K/uL   Lymphocytes Relative 26 %   Lymphs Abs 1.9 0.7 - 4.0 K/uL   Monocytes Relative 5 %   Monocytes Absolute 0.4 0.1 - 1.0 K/uL   Eosinophils Relative 2 %   Eosinophils Absolute 0.1 0.0 - 0.5 K/uL   Basophils Relative 0 %   Basophils Absolute 0.0 0.0 - 0.1 K/uL   Immature Granulocytes 0 %   Abs Immature Granulocytes 0.03 0.00 - 0.07 K/uL  Comprehensive metabolic panel   Collection Time: 02/07/23 12:55 PM  Result Value Ref Range   Sodium 138 135 - 145 mmol/L   Potassium 3.8 3.5 - 5.1 mmol/L   Chloride 104 98 - 111 mmol/L   CO2 25 22 - 32 mmol/L   Glucose, Bld 119 (H) 70 - 99 mg/dL   BUN 11 6 - 20 mg/dL   Creatinine, Ser 0.86 0.61 - 1.24 mg/dL   Calcium 9.3 8.9 - 57.8 mg/dL   Total Protein 7.7 6.5 - 8.1 g/dL   Albumin 3.8 3.5 - 5.0 g/dL   AST 40 15 - 41 U/L   ALT 55 (H) 0 - 44 U/L   Alkaline Phosphatase 51 38 - 126 U/L   Total Bilirubin 0.9 0.3 - 1.2 mg/dL   GFR, Estimated >46 >96 mL/min   Anion gap 9 5 - 15  Hepatitis C antibody   Collection Time: 02/07/23 12:55 PM  Result Value Ref Range   HCV Ab NON REACTIVE NON REACTIVE  HgB A1c   Collection Time: 02/07/23 12:55 PM  Result Value Ref Range   Hgb A1c MFr Bld 6.0 (H) 4.8 - 5.6 %  Mean Plasma Glucose 126 mg/dL  HIV Antibody (routine testing w rflx)   Collection Time: 02/07/23 12:55 PM  Result Value Ref Range   HIV Screen 4th  Generation wRfx Non Reactive Non Reactive  TSH   Collection Time: 02/07/23 12:55 PM  Result Value Ref Range   TSH 2.887 0.350 - 4.500 uIU/mL  Lipid panel   Collection Time: 02/07/23 12:55 PM  Result Value Ref Range   Cholesterol 170 0 - 200 mg/dL   Triglycerides 811 (H) <150 mg/dL   HDL 33 (L) >91 mg/dL   Total CHOL/HDL Ratio 5.2 RATIO   VLDL 53 (H) 0 - 40 mg/dL   LDL Cholesterol 84 0 - 99 mg/dL      Assessment & Plan:   Problem List Items Addressed This Visit       Cardiovascular and Mediastinum   Essential hypertension   Ongoing elevations >130/80.  Discussed with patient benefit of starting medication which he agrees with.  Will start Olmesartan 10 MG daily and adjust as needed.  Educated him on ARB family and side effects. Recommend he monitor BP at least a few mornings a week at home and document.  DASH diet at home.  Labs today: obtain next visit.  Return in 4 weeks.       Relevant Medications   olmesartan (BENICAR) 20 MG tablet     Other   Class 3 severe obesity due to excess calories without serious comorbidity with body mass index (BMI) of 50.0 to 59.9 in adult (HCC) - Primary   BMI 54.58.  Recommended eating smaller high protein, low fat meals more frequently and exercising 30 mins a day 5 times a week with a goal of 10-15lb weight loss in the next 3 months. Patient voiced their understanding and motivation to adhere to these recommendations.  Lab check today.       Numbness of arm   Ongoing, is attending neurology visit in new year for further testing.        Follow up plan: Return in about 4 weeks (around 06/06/2023) for HTN AND A1C CHECK.

## 2023-06-06 ENCOUNTER — Ambulatory Visit: Payer: No Typology Code available for payment source | Admitting: Nurse Practitioner

## 2023-06-15 ENCOUNTER — Ambulatory Visit: Payer: Commercial Managed Care - PPO | Admitting: Dermatology

## 2023-06-15 ENCOUNTER — Encounter: Payer: Self-pay | Admitting: Dermatology

## 2023-06-15 DIAGNOSIS — L821 Other seborrheic keratosis: Secondary | ICD-10-CM

## 2023-06-15 DIAGNOSIS — Z1283 Encounter for screening for malignant neoplasm of skin: Secondary | ICD-10-CM | POA: Diagnosis not present

## 2023-06-15 DIAGNOSIS — L578 Other skin changes due to chronic exposure to nonionizing radiation: Secondary | ICD-10-CM

## 2023-06-15 DIAGNOSIS — L814 Other melanin hyperpigmentation: Secondary | ICD-10-CM | POA: Diagnosis not present

## 2023-06-15 DIAGNOSIS — Z808 Family history of malignant neoplasm of other organs or systems: Secondary | ICD-10-CM

## 2023-06-15 DIAGNOSIS — D229 Melanocytic nevi, unspecified: Secondary | ICD-10-CM

## 2023-06-15 DIAGNOSIS — W908XXA Exposure to other nonionizing radiation, initial encounter: Secondary | ICD-10-CM

## 2023-06-15 DIAGNOSIS — L918 Other hypertrophic disorders of the skin: Secondary | ICD-10-CM

## 2023-06-15 DIAGNOSIS — D1801 Hemangioma of skin and subcutaneous tissue: Secondary | ICD-10-CM

## 2023-06-15 NOTE — Progress Notes (Signed)
New Patient Visit   Subjective  Jonathan Sherman is a 38 y.o. male who presents for the following: Skin Cancer Screening and Full Body Skin Exam patient is concerned with many skin tags mostly on neck and right axilla that he states is very big.   Hard bump on right abdomen he states present a few years. Patient reports that is sore and has grown over the past.   Patient reports a family history of skin cancer maternal grandfather and mother who had passed away with melanoma.    The patient presents for Total-Body Skin Exam (TBSE) for skin cancer screening and mole check. The patient has spots, moles and lesions to be evaluated, some may be new or changing and the patient may have concern these could be cancer.    The following portions of the chart were reviewed this encounter and updated as appropriate: medications, allergies, medical history  Review of Systems:  No other skin or systemic complaints except as noted in HPI or Assessment and Plan.  Objective  Well appearing patient in no apparent distress; mood and affect are within normal limits.  A full examination was performed including scalp, head, eyes, ears, nose, lips, neck, chest, axillae, abdomen, back, buttocks, bilateral upper extremities, bilateral lower extremities, hands, feet, fingers, toes, fingernails, and toenails. All findings within normal limits unless otherwise noted below.   Relevant physical exam findings are noted in the Assessment and Plan.    Assessment & Plan   SKIN CANCER SCREENING PERFORMED TODAY.  ACTINIC DAMAGE - Chronic condition, secondary to cumulative UV/sun exposure - diffuse scaly erythematous macules with underlying dyspigmentation - Recommend daily broad spectrum sunscreen SPF 30+ to sun-exposed areas, reapply every 2 hours as needed.  - Staying in the shade or wearing long sleeves, sun glasses (UVA+UVB protection) and wide brim hats (4-inch brim around the entire circumference of the hat)  are also recommended for sun protection.  - Call for new or changing lesions.  LENTIGINES, SEBORRHEIC KERATOSES, HEMANGIOMAS - Benign normal skin lesions - Benign-appearing - Call for any changes  MELANOCYTIC NEVI - Tan-brown and/or pink-flesh-colored symmetric macules and papules - Benign appearing on exam today - Observation - Call clinic for new or changing moles - Recommend daily use of broad spectrum spf 30+ sunscreen to sun-exposed areas.   Acrochordons (Skin Tags) At neck and right axilla  - Fleshy, skin-colored pedunculated papules - Benign appearing.  - Observe. - If desired, they can be removed with an in office procedure that is not covered by insurance. - Please call the clinic if you notice any new or changing lesions.  Skin tag removal generally is considered cosmetic and not covered by insurance.  Cosmetic removal fee is $115 for up to 15 tags removed. You can contact your insurance to see if skin tag removal is a medically necessary covered benefit.  Even if covered, copay and deductible payments would apply. CPT code: 16109 Diagnosis code: L91.8    FAMILY HISTORY OF SKIN CANCER What type(s):patient unsure with grandfather, melanoma  Who affected:maternal grandfather , mother had melanoma at neck and passed away  MULTIPLE BENIGN NEVI   LENTIGINES   SEBORRHEIC KERATOSES   ACROCHORDON   CHERRY ANGIOMA    Return in about 1 year (around 06/14/2024) for TBSE.  I, Asher Muir, CMA, am acting as scribe for Elie Goody, MD.   Documentation: I have reviewed the above documentation for accuracy and completeness, and I agree with the above.  Elie Goody, MD

## 2023-06-15 NOTE — Patient Instructions (Addendum)
Skin tag removal generally is considered cosmetic and not covered by insurance.  Cosmetic removal fee is $115 for up to 15 tags removed. You can contact your insurance to see if skin tag removal is a medically necessary covered benefit.  Even if covered, copay and deductible payments would apply. CPT code: 32355 Diagnosis code: L91.8    Melanoma ABCDEs  Melanoma is the most dangerous type of skin cancer, and is the leading cause of death from skin disease.  You are more likely to develop melanoma if you: Have light-colored skin, light-colored eyes, or red or blond hair Spend a lot of time in the sun Tan regularly, either outdoors or in a tanning bed Have had blistering sunburns, especially during childhood Have a close family member who has had a melanoma Have atypical moles or large birthmarks  Early detection of melanoma is key since treatment is typically straightforward and cure rates are extremely high if we catch it early.   The first sign of melanoma is often a change in a mole or a new dark spot.  The ABCDE system is a way of remembering the signs of melanoma.  A for asymmetry:  The two halves do not match. B for border:  The edges of the growth are irregular. C for color:  A mixture of colors are present instead of an even brown color. D for diameter:  Melanomas are usually (but not always) greater than 6mm - the size of a pencil eraser. E for evolution:  The spot keeps changing in size, shape, and color.  Please check your skin once per month between visits. You can use a small mirror in front and a large mirror behind you to keep an eye on the back side or your body.   If you see any new or changing lesions before your next follow-up, please call to schedule a visit.  Please continue daily skin protection including broad spectrum sunscreen SPF 30+ to sun-exposed areas, reapplying every 2 hours as needed when you're outdoors.   Staying in the shade or wearing long sleeves,  sun glasses (UVA+UVB protection) and wide brim hats (4-inch brim around the entire circumference of the hat) are also recommended for sun protection.     Due to recent changes in healthcare laws, you may see results of your pathology and/or laboratory studies on MyChart before the doctors have had a chance to review them. We understand that in some cases there may be results that are confusing or concerning to you. Please understand that not all results are received at the same time and often the doctors may need to interpret multiple results in order to provide you with the best plan of care or course of treatment. Therefore, we ask that you please give [place] 2 business days to thoroughly review all your results before contacting the office for clarification. Should we see a critical lab result, you will be contacted sooner.   If You Need Anything After Your Visit  If you have any questions or concerns for your doctor, please call our main line at [phone] and press option 4 to reach your doctor's medical assistant. If no one answers, please leave a voicemail as directed and we will return your call as soon as possible. Messages left after 4 pm will be answered the following business day.   You may also send [place] a message via MyChart. We typically respond to MyChart messages within 1-2 business days.  For prescription refills, please ask your  pharmacy to contact our office. Our fax number is [phone].  If you have an urgent issue when the clinic is closed that cannot wait until the next business day, you can page your doctor at the number below.    Please note that while we do our best to be available for urgent issues outside of office hours, we are not available 24/7.   If you have an urgent issue and are unable to reach [place], you may choose to seek medical care at your doctor's office, retail clinic, urgent care center, or emergency room.  If you have a medical emergency, please immediately  call 911 or go to the emergency department.  Pager Numbers  - Dr. Gwen Pounds: [phone]  - Dr. Roseanne Reno: [phone]  - Dr. Katrinka Blazing: [phone]   In the event of inclement weather, please call our main line at [phone] for an update on the status of any delays or closures.  Dermatology Medication Tips: Please keep the boxes that topical medications come in in order to help keep track of the instructions about where and how to use these. Pharmacies typically print the medication instructions only on the boxes and not directly on the medication tubes.   If your medication is too expensive, please contact our office at [phone] option 4 or send [place] a message through MyChart.   We are unable to tell what your co-pay for medications will be in advance as this is different depending on your insurance coverage. However, we may be able to find a substitute medication at lower cost or fill out paperwork to get insurance to cover a needed medication.   If a prior authorization is required to get your medication covered by your insurance company, please allow [place] 1-2 business days to complete this process.  Drug prices often vary depending on where the prescription is filled and some pharmacies may offer cheaper prices.  The website www.goodrx.com contains coupons for medications through different pharmacies. The prices here do not account for what the cost may be with help from insurance (it may be cheaper with your insurance), but the website can give you the price if you did not use any insurance.  - You can print the associated coupon and take it with your prescription to the pharmacy.  - You may also stop by our office during regular business hours and pick up a GoodRx coupon card.  - If you need your prescription sent electronically to a different pharmacy, notify our office through [facility] or by phone at [phone] option 4.     Si Usted Necesita Algo Despus de Su  Visita  Tambin puede enviarnos un mensaje a travs de Clinical cytogeneticist. Por lo general respondemos a los mensajes de MyChart en el transcurso de 1 a 2 das hbiles.  Para renovar recetas, por favor pida a su farmacia que se ponga en contacto con nuestra oficina. Annie Sable de fax es [place] [phone].  Si tiene un asunto urgente cuando la clnica est cerrada y que no puede esperar hasta el siguiente da hbil, puede llamar/localizar a su doctor(a) al nmero que aparece a continuacin.   Por favor, tenga en cuenta que aunque hacemos todo lo posible para estar disponibles para asuntos urgentes fuera del horario de [place], no estamos disponibles las 24 horas del da, los 7 [address]  [address] de la [place].   Si tiene un problema urgente y no puede comunicarse con nosotros, puede optar por buscar atencin mdica  en el consultorio de su doctor(a),  en una clnica privada, en un centro de atencin urgente o en una sala de emergencias.  Si tiene Engineer, drilling, por favor llame inmediatamente al 911 o vaya a la sala de emergencias.  Nmeros de bper  - Dr. Gwen Pounds: [phone]  - Dra. Roseanne Reno: [phone]  - Dr. Katrinka Blazing: [phone]   En caso de inclemencias del tiempo, por favor llame a Lacy Duverney principal al [phone] para una actualizacin sobre el [place] de cualquier retraso o cierre.  Consejos para la medicacin en dermatologa: Por favor, guarde las cajas en las que vienen los medicamentos de uso tpico para ayudarle a seguir las instrucciones sobre dnde y cmo usarlos. Las farmacias generalmente imprimen las instrucciones del medicamento slo en las cajas y no directamente en los tubos del [place].   Si su medicamento es muy caro, por favor, pngase en contacto con Rolm Gala llamando al [phone] y presione la opcin 4 o envenos un mensaje a travs de Clinical cytogeneticist.   No podemos decirle cul ser su copago por los medicamentos por adelantado ya que esto es diferente dependiendo de  la cobertura de su seguro. Sin embargo, es posible que podamos encontrar un medicamento sustituto a Audiological scientist un formulario para que el seguro cubra el medicamento que se considera necesario.   Si se requiere una autorizacin previa para que su compaa de seguros [place] su medicamento, por favor permtanos de 1 a 2 das hbiles para completar [address]  Los precios de los medicamentos varan con frecuencia dependiendo del Environmental consultant de dnde se surte la receta y alguna farmacias pueden ofrecer precios ms baratos.  El sitio web www.goodrx.com tiene cupones para medicamentos de Health and safety inspector. Los precios aqu no tienen en cuenta lo que podra costar con la ayuda del seguro (puede ser ms barato con su seguro), pero el sitio web puede darle el precio si no utiliz Tourist information centre manager.  - Puede imprimir el cupn correspondiente y llevarlo con su receta a la farmacia.  - Tambin puede pasar por nuestra oficina durante el horario de atencin regular y Education officer, museum una tarjeta de cupones de GoodRx.  - Si necesita que su receta se enve electrnicamente a una farmacia diferente, informe a nuestra oficina a travs de MyChart de [facility] o por telfono llamando al [phone] y presione la opcin 4.

## 2023-06-18 NOTE — Patient Instructions (Signed)
Be Involved in Caring For Your Health:  Taking Medications When medications are taken as directed, they can greatly improve your health. But if they are not taken as prescribed, they may not work. In some cases, not taking them correctly can be harmful. To help ensure your treatment remains effective and safe, understand your medications and how to take them. Bring your medications to each visit for review by your provider.  Your lab results, notes, and after visit summary will be available on My Chart. We strongly encourage you to use this feature. If lab results are abnormal the clinic will contact you with the appropriate steps. If the clinic does not contact you assume the results are satisfactory. You can always view your results on My Chart. If you have questions regarding your health or results, please contact the clinic during office hours. You can also ask questions on My Chart.  We at [facility] are grateful that you chose [place] to provide your care. We strive to provide evidence-based and compassionate care and are always looking for feedback. If you get a survey from the clinic please complete this so we can hear your opinions.  Hypothyroidism  Hypothyroidism is when the thyroid gland does not make enough of certain hormones. This is called an underactive thyroid. The thyroid gland is a small gland located in the lower front part of the neck, just in front of the windpipe (trachea). This gland makes hormones that help control how the body uses food for energy (metabolism) as well as how the heart and brain function. These hormones also play a role in keeping your bones strong. When the thyroid is underactive, it produces too little of the hormones thyroxine (T4) and triiodothyronine (T3). What are the causes? This condition may be caused by: Hashimoto's disease. This is a disease in which the body's disease-fighting system (immune system) attacks the thyroid gland. This is the  most common cause. Viral infections. Pregnancy. Certain medicines. Birth defects. Problems with a gland in the center of the brain (pituitary gland). Lack of enough iodine in the diet. Other causes may include: Past radiation treatments to the head or neck for cancer. Past treatment with radioactive iodine. Past exposure to radiation in the environment. Past surgical removal of part or all of the thyroid. What increases the risk? You are more likely to develop this condition if: You are male. You have a family history of thyroid conditions. You use a medicine called lithium. You take medicines that affect the immune system (immunosuppressants). What are the signs or symptoms? Common symptoms of this condition include: Not being able to tolerate cold. Feeling as though you have no energy (lethargy). Lack of appetite. Constipation. Sadness or depression. Weight gain that is not explained by a change in diet or exercise habits. Menstrual irregularity. Dry skin, coarse hair, or brittle nails. Other symptoms may include: Muscle pain. Slowing of thought processes. Poor memory. How is this diagnosed? This condition may be diagnosed based on: Your symptoms, your medical history, and a physical exam. Blood tests. You may also have imaging tests, such as an ultrasound or MRI. How is this treated? This condition is treated with medicine that replaces the thyroid hormones that your body does not make. After you begin treatment, it may take several weeks for symptoms to go away. Follow these instructions at home: Take over-the-counter and prescription medicines only as told by your health care provider. If you start taking any new medicines, tell your health care  provider. Keep all follow-up visits as told by your health care provider. This is important. As your condition improves, your dosage of thyroid hormone medicine may change. You will need to have blood tests regularly so that  your health care provider can monitor your condition. Contact a health care provider if: Your symptoms do not get better with treatment. You are taking thyroid hormone replacement medicine and you: Sweat a lot. Have tremors. Feel anxious. Lose weight rapidly. Cannot tolerate heat. Have emotional swings. Have diarrhea. Feel weak. Get help right away if: You have chest pain. You have an irregular heartbeat. You have a rapid heartbeat. You have difficulty breathing. These symptoms may be an emergency. Get help right away. Call 911. Do not wait to see if the symptoms will go away. Do not drive yourself to the hospital. Summary Hypothyroidism is when the thyroid gland does not make enough of certain hormones (it is underactive). When the thyroid is underactive, it produces too little of the hormones thyroxine (T4) and triiodothyronine (T3). The most common cause is Hashimoto's disease, a disease in which the body's disease-fighting system (immune system) attacks the thyroid gland. The condition can also be caused by viral infections, medicine, pregnancy, or past radiation treatment to the head or neck. Symptoms may include weight gain, dry skin, constipation, feeling as though you do not have energy, and not being able to tolerate cold. This condition is treated with medicine to replace the thyroid hormones that your body does not make. This information is not intended to replace advice given to you by your health care provider. Make sure you discuss any questions you have with your health care provider. Document Revised: 05/11/2021 Document Reviewed: 05/11/2021 Elsevier Patient Education  2024 [employer].

## 2023-06-21 ENCOUNTER — Ambulatory Visit: Payer: No Typology Code available for payment source | Admitting: Nurse Practitioner

## 2023-06-21 ENCOUNTER — Encounter: Payer: Self-pay | Admitting: Nurse Practitioner

## 2023-06-21 VITALS — BP 130/82 | HR 91 | Temp 98.4°F | Ht 72.0 in | Wt 399.8 lb

## 2023-06-21 DIAGNOSIS — R7303 Prediabetes: Secondary | ICD-10-CM | POA: Diagnosis not present

## 2023-06-21 DIAGNOSIS — E66813 Obesity, class 3: Secondary | ICD-10-CM

## 2023-06-21 DIAGNOSIS — G4733 Obstructive sleep apnea (adult) (pediatric): Secondary | ICD-10-CM | POA: Diagnosis not present

## 2023-06-21 DIAGNOSIS — Z6841 Body Mass Index (BMI) 40.0 and over, adult: Secondary | ICD-10-CM

## 2023-06-21 DIAGNOSIS — I10 Essential (primary) hypertension: Secondary | ICD-10-CM | POA: Diagnosis not present

## 2023-06-21 LAB — MICROALBUMIN, URINE WAIVED
Creatinine, Urine Waived: 200 mg/dL (ref 10–300)
Microalb, Ur Waived: 30 mg/L — ABNORMAL HIGH (ref 0–19)
Microalb/Creat Ratio: <30 mg/g (ref <30)

## 2023-06-21 LAB — BAYER DCA HB A1C WAIVED: HB A1C (BAYER DCA - WAIVED): 5.9 % — ABNORMAL HIGH (ref 4.8–5.6)

## 2023-06-21 NOTE — Assessment & Plan Note (Signed)
A1c today 5.9%, slight trend down from previous of 6%.  Will continue to monitor closely due to significant family history.  He is aware what symptoms to report immediately to provider.

## 2023-06-21 NOTE — Assessment & Plan Note (Signed)
Ongoing, but BP is trending down to goal range.  Continue Olmesartan 10 MG daily and adjust as needed.  Educated him on ARB family and side effects. Recommend he monitor BP at least a few mornings a week at home and document.  DASH diet at home.  Labs today: CMP and urine ALB.

## 2023-06-21 NOTE — Assessment & Plan Note (Signed)
BMI 54.22.  Recommended eating smaller high protein, low fat meals more frequently and exercising 30 mins a day 5 times a week with a goal of 10-15lb weight loss in the next 3 months. Patient voiced their understanding and motivation to adhere to these recommendations.  Lab check today.

## 2023-06-21 NOTE — Progress Notes (Signed)
BP 130/82 (BP Location: Left Arm, Patient Position: Sitting, Cuff Size: Large)   Pulse 91   Temp 98.4 F (36.9 C) (Oral)   Ht 6' (1.829 m)   Wt (!) 399 lb 12.8 oz (181.3 kg)   SpO2 97%   BMI 54.22 kg/m    Subjective:    Patient ID: Jonathan Sherman, male    DOB: 08/25/1985, 38 y.o.   MRN: [id number]  HPI: Jonathan Sherman is a 38 y.o. male  Chief Complaint  Patient presents with   Hypertension   HYPERTENSION without Chronic Kidney Disease Started on Olmesartan 10 MG daily on 05/09/23.  No ADR with this.  Continues to use CPAP nightly. Hypertension status: controlled  Satisfied with current treatment? yes Duration of hypertension: chronic BP monitoring frequency:  not checking BP range:  BP medication side effects:  no Medication compliance: good compliance Aspirin: no Recurrent headaches: no Visual changes: no Palpitations: no Dyspnea: no Chest pain: no Lower extremity edema: no Dizzy/lightheaded: no  The ASCVD Risk score (Arnett DK, et al., 2019) failed to calculate for the following reasons:   The 2019 ASCVD risk score is only valid for ages 91 to 21  Impaired Fasting Glucose Focused on diet at home. HbA1C:  Lab Results  Component Value Date   HGBA1C 6.0 (H) 02/07/2023  Duration of elevated blood sugar: months Polydipsia: no Polyuria: no Weight change: no Visual disturbance: no Glucose Monitoring: no    Accucheck frequency: Not Checking    Fasting glucose:     Post prandial:  Diabetic Education: Not Completed Family history of diabetes: no   Relevant past medical, surgical, family and social history reviewed and updated as indicated. Interim medical history since our last visit reviewed. Allergies and medications reviewed and updated.  Review of Systems  Constitutional:  Negative for activity change, diaphoresis, fatigue and fever.  Respiratory:  Negative for cough, chest tightness, shortness of breath and wheezing.   Cardiovascular:  Negative for chest  pain, palpitations and leg swelling.  Gastrointestinal: Negative.   Endocrine: Negative for polydipsia, polyphagia and polyuria.  Neurological: Negative.   Psychiatric/Behavioral: Negative.      Per HPI unless specifically indicated above     Objective:    BP 130/82 (BP Location: Left Arm, Patient Position: Sitting, Cuff Size: Large)   Pulse 91   Temp 98.4 F (36.9 C) (Oral)   Ht 6' (1.829 m)   Wt (!) 399 lb 12.8 oz (181.3 kg)   SpO2 97%   BMI 54.22 kg/m   Wt Readings from Last 3 Encounters:  06/21/23 (!) 399 lb 12.8 oz (181.3 kg)  05/09/23 (!) 402 lb 6.4 oz (182.5 kg)  02/07/23 (!) 400 lb 9.6 oz (181.7 kg)    Physical Exam Vitals and nursing note reviewed.  Constitutional:      General: He is awake. He is not in acute distress.    Appearance: Normal appearance. He is well-developed and well-groomed. He is obese. He is not ill-appearing or toxic-appearing.  HENT:     Head: Normocephalic.     Right Ear: Hearing and external ear normal.     Left Ear: Hearing and external ear normal.  Eyes:     General: Lids are normal.     Extraocular Movements: Extraocular movements intact.     Conjunctiva/sclera: Conjunctivae normal.  Neck:     Thyroid: No thyromegaly.     Vascular: No carotid bruit.  Cardiovascular:     Rate and Rhythm: Normal rate and  regular rhythm.     Heart sounds: Normal heart sounds. No murmur heard.    No gallop.  Pulmonary:     Effort: No accessory muscle usage or respiratory distress.     Breath sounds: Normal breath sounds.  Abdominal:     General: Bowel sounds are normal. There is no distension.     Palpations: Abdomen is soft.     Tenderness: There is no abdominal tenderness.  Musculoskeletal:     Cervical back: Full passive range of motion without pain.     Right lower leg: No edema.     Left lower leg: No edema.  Lymphadenopathy:     Cervical: No cervical adenopathy.  Skin:    General: Skin is warm.     Capillary Refill: Capillary refill  takes less than 2 seconds.  Neurological:     Mental Status: He is alert and oriented to person, place, and time.     Deep Tendon Reflexes: Reflexes are normal and symmetric.     Reflex Scores:      Brachioradialis reflexes are 2+ on the right side and 2+ on the left side.      Patellar reflexes are 2+ on the right side and 2+ on the left side. Psychiatric:        Attention and Perception: Attention normal.        Mood and Affect: Mood normal.        Speech: Speech normal.        Behavior: Behavior normal. Behavior is cooperative.        Thought Content: Thought content normal.     Results for orders placed or performed during the hospital encounter of 02/07/23  CBC with Differential/Platelet   Collection Time: 02/07/23 12:55 PM  Result Value Ref Range   WBC 7.3 4.0 - 10.5 K/uL   RBC 4.85 4.22 - 5.81 MIL/uL   Hemoglobin 13.8 13.0 - 17.0 g/dL   HCT 91.4 78.2 - 95.6 %   MCV 86.2 80.0 - 100.0 fL   MCH 28.5 26.0 - 34.0 pg   MCHC 33.0 30.0 - 36.0 g/dL   RDW 21.3 08.6 - 57.8 %   Platelets 217 150 - 400 K/uL   nRBC 0.0 0.0 - 0.2 %   Neutrophils Relative % 67 %   Neutro Abs 4.8 1.7 - 7.7 K/uL   Lymphocytes Relative 26 %   Lymphs Abs 1.9 0.7 - 4.0 K/uL   Monocytes Relative 5 %   Monocytes Absolute 0.4 0.1 - 1.0 K/uL   Eosinophils Relative 2 %   Eosinophils Absolute 0.1 0.0 - 0.5 K/uL   Basophils Relative 0 %   Basophils Absolute 0.0 0.0 - 0.1 K/uL   Immature Granulocytes 0 %   Abs Immature Granulocytes 0.03 0.00 - 0.07 K/uL  Comprehensive metabolic panel   Collection Time: 02/07/23 12:55 PM  Result Value Ref Range   Sodium 138 135 - 145 mmol/L   Potassium 3.8 3.5 - 5.1 mmol/L   Chloride 104 98 - 111 mmol/L   CO2 25 22 - 32 mmol/L   Glucose, Bld 119 (H) 70 - 99 mg/dL   BUN 11 6 - 20 mg/dL   Creatinine, Ser 4.69 0.61 - 1.24 mg/dL   Calcium 9.3 8.9 - 62.9 mg/dL   Total Protein 7.7 6.5 - 8.1 g/dL   Albumin 3.8 3.5 - 5.0 g/dL   AST 40 15 - 41 U/L   ALT 55 (H) 0 - 44 U/L    Alkaline  Phosphatase 51 38 - 126 U/L   Total Bilirubin 0.9 0.3 - 1.2 mg/dL   GFR, Estimated >16 >10 mL/min   Anion gap 9 5 - 15  Hepatitis C antibody   Collection Time: 02/07/23 12:55 PM  Result Value Ref Range   HCV Ab NON REACTIVE NON REACTIVE  HgB A1c   Collection Time: 02/07/23 12:55 PM  Result Value Ref Range   Hgb A1c MFr Bld 6.0 (H) 4.8 - 5.6 %   Mean Plasma Glucose 126 mg/dL  HIV Antibody (routine testing w rflx)   Collection Time: 02/07/23 12:55 PM  Result Value Ref Range   HIV Screen 4th Generation wRfx Non Reactive Non Reactive  TSH   Collection Time: 02/07/23 12:55 PM  Result Value Ref Range   TSH 2.887 0.350 - 4.500 uIU/mL  Lipid panel   Collection Time: 02/07/23 12:55 PM  Result Value Ref Range   Cholesterol 170 0 - 200 mg/dL   Triglycerides 960 (H) <150 mg/dL   HDL 33 (L) >45 mg/dL   Total CHOL/HDL Ratio 5.2 RATIO   VLDL 53 (H) 0 - 40 mg/dL   LDL Cholesterol 84 0 - 99 mg/dL      Assessment & Plan:   Problem List Items Addressed This Visit       Cardiovascular and Mediastinum   Essential hypertension   Ongoing, but BP is trending down to goal range.  Continue Olmesartan 10 MG daily and adjust as needed.  Educated him on ARB family and side effects. Recommend he monitor BP at least a few mornings a week at home and document.  DASH diet at home.  Labs today: CMP and urine ALB.         Relevant Orders   Microalbumin, Urine Waived   Comprehensive metabolic panel     Respiratory   OSA on CPAP   Chronic, ongoing.  Consistently uses CPAP.  Recommend continued 100% use of this.        Other   Class 3 severe obesity due to excess calories without serious comorbidity with body mass index (BMI) of 50.0 to 59.9 in adult (HCC) - Primary   BMI 54.22.  Recommended eating smaller high protein, low fat meals more frequently and exercising 30 mins a day 5 times a week with a goal of 10-15lb weight loss in the next 3 months. Patient voiced their understanding and  motivation to adhere to these recommendations.  Lab check today.       Prediabetes   A1c today 5.9%, slight trend down from previous of 6%.  Will continue to monitor closely due to significant family history.  He is aware what symptoms to report immediately to provider.      Relevant Orders   Bayer DCA Hb A1c Waived   Microalbumin, Urine Waived   Comprehensive metabolic panel   Lipid Panel w/o Chol/HDL Ratio     Follow up plan: Return in about 6 months (around 12/19/2023) for HTN/HLD, IFG.

## 2023-06-21 NOTE — Assessment & Plan Note (Signed)
Chronic, ongoing.  Consistently uses CPAP.  Recommend continued 100% use of this.

## 2023-06-22 ENCOUNTER — Encounter: Payer: Self-pay | Admitting: Nurse Practitioner

## 2023-06-22 LAB — COMPREHENSIVE METABOLIC PANEL
ALT: 50 [IU]/L — ABNORMAL HIGH (ref 0–44)
AST: 42 [IU]/L — ABNORMAL HIGH (ref 0–40)
Albumin: 4.8 g/dL (ref 4.1–5.1)
Alkaline Phosphatase: 69 [IU]/L (ref 44–121)
BUN/Creatinine Ratio: 16 (ref 9–20)
BUN: 13 mg/dL (ref 6–20)
Bilirubin Total: 0.5 mg/dL (ref 0.0–1.2)
CO2: 22 mmol/L (ref 20–29)
Calcium: 9.9 mg/dL (ref 8.7–10.2)
Chloride: 100 mmol/L (ref 96–106)
Creatinine, Ser: 0.83 mg/dL (ref 0.76–1.27)
Globulin, Total: 2.9 g/dL (ref 1.5–4.5)
Glucose: 74 mg/dL (ref 70–99)
Potassium: 4.5 mmol/L (ref 3.5–5.2)
Sodium: 141 mmol/L (ref 134–144)
Total Protein: 7.7 g/dL (ref 6.0–8.5)
eGFR: 117 mL/min/{1.73_m2} (ref >59)

## 2023-06-22 LAB — LIPID PANEL W/O CHOL/HDL RATIO
Cholesterol, Total: 195 mg/dL (ref 100–199)
HDL: 41 mg/dL (ref >39)
LDL Chol Calc (NIH): 112 mg/dL — ABNORMAL HIGH (ref 0–99)
Triglycerides: 239 mg/dL — ABNORMAL HIGH (ref 0–149)
VLDL Cholesterol Cal: 42 mg/dL — ABNORMAL HIGH (ref 5–40)

## 2023-06-22 NOTE — Progress Notes (Signed)
Contacted via MyChart   Good evening Jonathan Sherman, your labs have returned: - Kidney function, creatinine and eGFR, remains normal. Liver function is showing some mild elevations, ALT and AST, try to minimally use Tylenol or drink alcohol and we will recheck at future visit. - Lipid panel continues to show elevation, but no medication needed at this time.  Any questions? Keep being stellar!!  Thank you for allowing me to participate in your care.  I appreciate you. Kindest regards, Quindarius Cabello

## 2023-08-24 ENCOUNTER — Ambulatory Visit (INDEPENDENT_AMBULATORY_CARE_PROVIDER_SITE_OTHER): Admitting: Internal Medicine

## 2023-08-24 ENCOUNTER — Encounter: Payer: Self-pay | Admitting: Internal Medicine

## 2023-08-24 ENCOUNTER — Other Ambulatory Visit: Payer: Self-pay | Admitting: Neurology

## 2023-08-24 VITALS — BP 124/70 | HR 109 | Ht 72.0 in | Wt >= 6400 oz

## 2023-08-24 DIAGNOSIS — Z024 Encounter for examination for driving license: Secondary | ICD-10-CM

## 2023-08-24 DIAGNOSIS — R200 Anesthesia of skin: Secondary | ICD-10-CM

## 2023-08-24 NOTE — Progress Notes (Signed)
 Commercial Driver Medical Examination   Jonathan Sherman is a 38 y.o. male who presents today for a commercial driver fitness determination physical exam. The patient reports no problems. The following portions of the patient's history were reviewed and updated as appropriate: allergies, current medications, past family history, past medical history, past social history, past surgical history, and problem list. Review of Systems  No past medical history on file.  Current Outpatient Medications  Medication Sig Dispense Refill   FOLIC ACID PO Take 300 mg by mouth daily. Take 2 tablets by mouth daily     magnesium oxide (MAG-OX) 400 (240 Mg) MG tablet Take 400 mg by mouth daily.     meloxicam (MOBIC) 7.5 MG tablet Take 7.5 mg by mouth daily.     olmesartan (BENICAR) 20 MG tablet Take 0.5 tablets (10 mg total) by mouth daily. 45 tablet 2   No current facility-administered medications for this visit.    No Known Allergies  Family History  Problem Relation Age of Onset   Melanoma Mother    Heart disease Father    Hypertension Father    Heart attack Father    Hypertension Sister    Diabetes Brother    Diabetes Brother    COPD Maternal Grandmother    Heart disease Maternal Grandfather    Hypertension Maternal Grandfather    Lymphoma Maternal Grandfather     Social History   Socioeconomic History   Marital status: Married    Spouse name: Not on file   Number of children: 1   Years of education: Not on file   Highest education level: Not on file  Occupational History   Not on file  Tobacco Use   Smoking status: Never   Smokeless tobacco: Never  Vaping Use   Vaping status: Never Used  Substance and Sexual Activity   Alcohol use: Never   Drug use: Never   Sexual activity: Yes    Birth control/protection: None  Other Topics Concern   Not on file  Social History Narrative   Not on file   Social Drivers of Health   Financial Resource Strain: Medium Risk (07/26/2023)    Received from [facility]   Overall Financial Resource Strain (CARDIA)    Difficulty of Paying Living Expenses: Somewhat hard  Food Insecurity: Food Insecurity Present (07/26/2023)   Received from [facility]   Hunger Vital Sign    Worried About Running Out of Food in the Last Year: Sometimes true    Ran Out of Food in the Last Year: Sometimes true  Transportation Needs: No Transportation Needs (07/26/2023)   Received from [facility] - [facility]    In the past 12 months, has lack of transportation kept you from medical appointments or from getting medications?: No    Lack of Transportation (Non-Medical): No  Physical Activity: Sufficiently Active (05/09/2023)   Exercise Vital Sign    Days of Exercise per Week: 5 days    Minutes of Exercise per Session: 70 min  Stress: No Stress Concern Present (05/09/2023)   [employer] of Occupational Health - Occupational Stress Questionnaire    Feeling of Stress : Not at all  Social Connections: Moderately Integrated (05/09/2023)   Social Connection and Isolation Panel [NHANES]    Frequency of Communication with Friends and Family: More than three times a week    Frequency of Social Gatherings with Friends and Family: Once a week    Attends  Religious Services: 1 to 4 times per year    Active Member of Clubs or Organizations: No    Attends Banker Meetings: Never    Marital Status: Living with partner  Intimate Partner Violence: Not At Risk (05/09/2023)   Humiliation, Afraid, Rape, and Kick questionnaire    Fear of Current or Ex-Partner: No    Emotionally Abused: No    Physically Abused: No    Sexually Abused: No     Constitutional: Denies fever, malaise, fatigue, headache or abrupt weight changes.  HEENT: Denies eye pain, eye redness, ear pain, ringing in the ears, wax buildup, runny nose, nasal congestion, bloody nose, or sore throat. Respiratory: Denies  difficulty breathing, shortness of breath, cough or sputum production.   Cardiovascular: Denies chest pain, chest tightness, palpitations or swelling in the hands or feet.  Gastrointestinal: Denies abdominal pain, bloating, constipation, diarrhea or blood in the stool.  GU: Denies urgency, frequency, pain with urination, burning sensation, blood in urine, odor or discharge. Musculoskeletal: Denies decrease in range of motion, difficulty with gait, muscle pain or joint pain and swelling.  Skin: Denies redness, rashes, lesions or ulcercations.  Neurological: Denies dizziness, difficulty with memory, difficulty with speech or problems with balance and coordination.  Psych: Denies anxiety, depression, SI/HI.  No other specific complaints in a complete review of systems (except as listed in HPI above).   Objective:    Vision:  Uncorrected Corrected Horizontal Field of Vision  Right Eye  20/20 70 degrees  Left Eye   20/20 70 degrees  Both Eyes   20/20    Applicant can recognize and distinguish among traffic control signals and devices showing standard red, green, and amber colors.  Applicant meets visual acuity requirement only when wearing corrective lenses.  Monocular Vision?: No   Hearing:        Right Ear  > 5 ft     Left Ear  > 62ft        BP 124/70   Pulse (!) 109   Ht 6' (1.829 m)   Wt (!) 406 lb (184.2 kg)   SpO2 99%   BMI 55.06 kg/m   Wt Readings from Last 3 Encounters:  06/21/23 (!) 399 lb 12.8 oz (181.3 kg)  05/09/23 (!) 402 lb 6.4 oz (182.5 kg)  02/07/23 (!) 400 lb 9.6 oz (181.7 kg)    General: Appears his stated age, obese in NAD. Skin: Warm, dry and intact. No rashes, lesions or ulcerations noted. HEENT: Head: normal shape and size; Eyes: sclera white, no icterus, conjunctiva pink, PERRLA and EOMs intact; Ears: Tm's gray and intact, normal light reflex; Nose: mucosa pink and moist, septum midline; Throat/Mouth: Teeth present, mucosa pink and moist, no exudate,  lesions or ulcerations noted.  Neck:  Neck supple, trachea midline. No masses, lumps or thyromegaly present.  Cardiovascular: Tachycardic with normal rhythm. S1,S2 noted.  No murmur, rubs or gallops noted. No JVD.  Trace nonpitting BLE edema. No carotid bruits noted. Pulmonary/Chest: Normal effort and positive vesicular breath sounds. No respiratory distress. No wheezes, rales or ronchi noted.  Abdomen: Soft and nontender. Normal bowel sounds. No distention or masses noted. Liver, spleen and kidneys non palpable. Musculoskeletal: Normal range of motion.  Strength 5/5 BUE/BLE.  No difficulty with gait.  Neurological: Alert and oriented. Cranial nerves II-XII grossly intact. Coordination normal.  Psychiatric: Mood and affect normal. Behavior is normal. Judgment and thought content normal.   BMET    Component Value Date/Time   NA  141 06/21/2023 1550   NA 135 (L) 06/03/2013 1116   K 4.5 06/21/2023 1550   K 4.5 06/03/2013 1116   CL 100 06/21/2023 1550   CL 103 06/03/2013 1116   CO2 22 06/21/2023 1550   CO2 31 06/03/2013 1116   GLUCOSE 74 06/21/2023 1550   GLUCOSE 119 (H) 02/07/2023 1255   GLUCOSE 73 06/03/2013 1116   BUN 13 06/21/2023 1550   BUN 12 06/03/2013 1116   CREATININE 0.83 06/21/2023 1550   CREATININE 0.84 06/03/2013 1116   CALCIUM 9.9 06/21/2023 1550   CALCIUM 9.4 06/03/2013 1116   GFRNONAA >60 02/07/2023 1255   GFRNONAA >60 06/03/2013 1116   GFRAA >60 06/03/2013 1116    Lipid Panel     Component Value Date/Time   CHOL 195 06/21/2023 1550   TRIG 239 (H) 06/21/2023 1550   HDL 41 06/21/2023 1550   CHOLHDL 5.2 02/07/2023 1255   VLDL 53 (H) 02/07/2023 1255   LDLCALC 112 (H) 06/21/2023 1550    CBC    Component Value Date/Time   WBC 7.3 02/07/2023 1255   RBC 4.85 02/07/2023 1255   HGB 13.8 02/07/2023 1255   HGB 15.5 06/03/2013 1116   HCT 41.8 02/07/2023 1255   HCT 46.2 06/03/2013 1116   PLT 217 02/07/2023 1255   PLT 220 06/03/2013 1116   MCV 86.2 02/07/2023 1255    MCV 87 06/03/2013 1116   MCH 28.5 02/07/2023 1255   MCHC 33.0 02/07/2023 1255   RDW 12.6 02/07/2023 1255   RDW 12.7 06/03/2013 1116   LYMPHSABS 1.9 02/07/2023 1255   LYMPHSABS 2.1 06/03/2013 1116   MONOABS 0.4 02/07/2023 1255   MONOABS 1.0 06/03/2013 1116   EOSABS 0.1 02/07/2023 1255   EOSABS 0.2 06/03/2013 1116   BASOSABS 0.0 02/07/2023 1255   BASOSABS 0.1 06/03/2013 1116    Hgb A1C Lab Results  Component Value Date   HGBA1C 5.9 (H) 06/21/2023        Labs:  Urinalysis:  Specific gravity: 1.020  Blood: Trace  Glucose: Negative  Protein: Negative    Assessment:    Healthy male exam.  Meets standards, but periodic monitoring required due to HTN.  Driver qualified only for 1 year.    Plan:    Medical examiners certificate completed and printed. Return as needed.   Nicki Reaper, NP

## 2023-09-01 ENCOUNTER — Emergency Department
Admission: EM | Admit: 2023-09-01 | Discharge: 2023-09-01 | Disposition: A | Discharge status: Home / Self Care | Payer: Worker's Compensation | Attending: Emergency Medicine | Admitting: Emergency Medicine

## 2023-09-01 ENCOUNTER — Encounter: Admitting: Nurse Practitioner

## 2023-09-01 ENCOUNTER — Other Ambulatory Visit: Payer: Self-pay

## 2023-09-01 DIAGNOSIS — S51832D Puncture wound without foreign body of left forearm, subsequent encounter: Secondary | ICD-10-CM | POA: Diagnosis not present

## 2023-09-01 DIAGNOSIS — S61431D Puncture wound without foreign body of right hand, subsequent encounter: Secondary | ICD-10-CM | POA: Insufficient documentation

## 2023-09-01 DIAGNOSIS — Z23 Encounter for immunization: Secondary | ICD-10-CM | POA: Insufficient documentation

## 2023-09-01 DIAGNOSIS — S81831D Puncture wound without foreign body, right lower leg, subsequent encounter: Secondary | ICD-10-CM | POA: Diagnosis not present

## 2023-09-01 DIAGNOSIS — Y990 Civilian activity done for income or pay: Secondary | ICD-10-CM | POA: Diagnosis not present

## 2023-09-01 DIAGNOSIS — W5501XD Bitten by cat, subsequent encounter: Secondary | ICD-10-CM | POA: Insufficient documentation

## 2023-09-01 DIAGNOSIS — S6991XD Unspecified injury of right wrist, hand and finger(s), subsequent encounter: Secondary | ICD-10-CM | POA: Diagnosis present

## 2023-09-01 MED ORDER — RABIES VACCINE, PCEC IM SUSR
1.0000 mL | Freq: Once | INTRAMUSCULAR | Status: AC
  Administered 2023-09-01: 1 mL via INTRAMUSCULAR
  Filled 2023-09-01: qty 1

## 2023-09-01 MED ORDER — RABIES IMMUNE GLOBULIN 150 UNIT/ML IM INJ: 20.0000 [IU]/kg | INJECTION | Freq: Once | INTRAMUSCULAR | Status: DC

## 2023-09-01 MED ORDER — RABIES IMMUNE GLOBULIN 150 UNIT/ML IM INJ
675.0000 [IU] | INJECTION | Freq: Once | INTRAMUSCULAR | Status: AC
  Administered 2023-09-01: 675 [IU]
  Filled 2023-09-01: qty 6

## 2023-09-01 MED ORDER — RABIES IMMUNE GLOBULIN 150 UNIT/ML IM INJ
3000.0000 [IU] | INJECTION | Freq: Once | INTRAMUSCULAR | Status: AC
  Administered 2023-09-01: 3000 [IU]
  Filled 2023-09-01: qty 20

## 2023-09-01 NOTE — Discharge Instructions (Signed)
 You will need a total of 4 rabies shots.  He received 1 today you will need another in 3 days, 7 days, 14 days.  You can return to the emergency department or more efficiently follow-up with urgent care listed below.  [facility] health urgent care [facility] [address], [place], [place] [address] Open Monday through Friday 8 AM to 4 PM Or [facility] health urgent care at [facility] [address] [place], [place] [address] Open Monday through Friday 8 AM to 8 PM.  Saturday and Sunday 8 AM to 4 PM

## 2023-09-01 NOTE — ED Notes (Signed)
 Pt reports this will be a workers comp case however he does not need a drug test today. Pt reports no dizziness, light headedness, or any other symptoms from his vaccine today.

## 2023-09-01 NOTE — ED Notes (Signed)
 Pt advised the cat that bit him was a stray and ran off into the woods. Unknown where the cat is or went.

## 2023-09-01 NOTE — ED Triage Notes (Signed)
 Pt to ed from home via POV for "rabies vaccine sent over from [facility]" for a cat bite via a stray cat at work. Bite occurred at 3pm today on his right and left arm. Pt is caox4, in no acute distress and ambulatory in triage.

## 2023-09-01 NOTE — ED Notes (Signed)
 Myself, RN Chelsea, and Dr. Scotty Court administered the series of IM injections, and PA Sean administered the injections around the bite site.

## 2023-09-01 NOTE — ED Provider Notes (Signed)
 [facility] Provider Note   CSN: [id number] Arrival date & time: 09/01/23  1809     History  Chief Complaint  Patient presents with   Animal Bite    WC claim    Jonathan Sherman is a 38 y.o. male.  Patient here for animal bite.  He was bit by a feral cat earlier today while at work.  Bite of bilateral forearms and right thumb.  No numbness or weakness.  He was already seen urgent care updated tetanus and started on Augmentin antibiotics with a prescription sent in.  He was sent here for rabies vaccine and immunoglobulin.  No history of rabies vaccination or immunosuppression.   Animal Bite Associated symptoms: no fever and no numbness        Home Medications Prior to Admission medications   Medication Sig Start Date End Date Taking? Authorizing Provider  FOLIC ACID PO Take 300 mg by mouth daily. Take 2 tablets by mouth daily    [provider]  gabapentin (NEURONTIN) 300 MG capsule Take 300 mg by mouth. 08/22/23 08/21/24  [provider]  magnesium oxide (MAG-OX) 400 (240 Mg) MG tablet Take 400 mg by mouth daily.    [provider]  meloxicam (MOBIC) 7.5 MG tablet Take 7.5 mg by mouth daily.    [provider]  olmesartan (BENICAR) 20 MG tablet Take 0.5 tablets (10 mg total) by mouth daily. 05/09/23   Cannady, Corrie Dandy T, NP  tamsulosin (FLOMAX) 0.4 MG CAPS capsule Take 0.4 mg by mouth daily. 06/25/23   [provider]  Vitamin D, Ergocalciferol, (DRISDOL) 1.25 MG (50000 UNIT) CAPS capsule Take 50,000 Units by mouth once a week. 07/28/23   [provider]      Allergies    Patient has no known allergies.    Review of Systems   Review of Systems  Constitutional:  Negative for chills and fever.  Respiratory:  Negative for cough.   Cardiovascular:  Negative for chest pain.  Gastrointestinal:  Negative for abdominal pain.  Skin:  Positive for wound.  Neurological:  Negative for weakness and  numbness.    Physical Exam Updated Vital Signs BP (!) 126/93   Pulse (!) 105   Temp 99.5 F (37.5 C) (Oral)   Resp 16   Ht 6' (1.829 m)   SpO2 98%   BMI 55.06 kg/m  Physical Exam Vitals reviewed.  Constitutional:      Appearance: Normal appearance.  HENT:     Head: Normocephalic and atraumatic.     Nose: Nose normal.  Cardiovascular:     Pulses: Normal pulses.  Pulmonary:     Effort: Pulmonary effort is normal.  Musculoskeletal:     Cervical back: Normal range of motion.  Skin:    Comments: Small puncture wound to the right thenar eminence without any drainage no bony tenderness.  Full opposition and full range of motion of thumb capillary refill intact.  Also 1 puncture wound and scratch on medial aspect of right lower arm without any localized bony tenderness no drainage.  Left lower arm with a similar medial puncture wound.  Neurological:     Mental Status: He is alert and oriented to person, place, and time. Mental status is at baseline.  Psychiatric:        Mood and Affect: Mood normal.        Behavior: Behavior normal.     ED Results / Procedures / Treatments   Labs (  all labs ordered are listed, but only abnormal results are displayed) Labs Reviewed - No data to display  EKG None  Radiology No results found.  Procedures Procedures    Medications Ordered in ED Medications  rabies vaccine (RABAVERT) injection 1 mL (has no administration in time range)  rabies immune globulin (HYPERRAB/KEDRAB) injection 3,000 Units (has no administration in time range)    And  rabies immune globulin (HYPERRAB/KEDRAB) injection 675 Units (has no administration in time range)    ED Course/ Medical Decision Making/ A&P                                 Medical Decision Making Patient here for rabies vaccine and immunoglobulin.  Bit by wild cat earlier today no history of rabies vaccine or immunosuppression does have several small puncture wounds without any evidence of  bony injury nor significant infection he is hemodynamically stable and the bite occurred earlier today.  Already started on Augmentin with a prescription sent in as well.  Tetanus was already updated.  Will order rabies shots here.  Recommended follow-up in 3 7 and 14 days at [facility] health urgent care or here.  Risk Prescription drug management.           Final Clinical Impression(s) / ED Diagnoses Final diagnoses:  Cat bite, subsequent encounter  Need for immunization against rabies    Rx / DC Orders ED Discharge Orders     None         Christen Bame, Cordelia Poche 09/01/23 Ninfa Linden    Sharman Cheek, MD 09/01/23 2246

## 2023-09-12 ENCOUNTER — Ambulatory Visit
Admission: RE | Admit: 2023-09-12 | Discharge: 2023-09-12 | Disposition: A | Discharge status: Home / Self Care | Source: Ambulatory Visit | Attending: Neurology | Admitting: Neurology

## 2023-09-12 DIAGNOSIS — R200 Anesthesia of skin: Secondary | ICD-10-CM

## 2023-09-12 MED ORDER — GADOPICLENOL 0.5 MMOL/ML IV SOLN
10.0000 mL | Freq: Once | INTRAVENOUS | Status: AC | PRN
  Administered 2023-09-12: 10 mL via INTRAVENOUS

## 2023-11-14 ENCOUNTER — Encounter: Payer: Self-pay | Admitting: Nurse Practitioner

## 2023-12-18 NOTE — Patient Instructions (Incomplete)
 Be Involved in Caring For Your Health:  Taking Medications When medications are taken as directed, they can greatly improve your health. But if they are not taken as prescribed, they may not work. In some cases, not taking them correctly can be harmful. To help ensure your treatment remains effective and safe, understand your medications and how to take them. Bring your medications to each visit for review by your provider.  Your lab results, notes, and after visit summary will be available on My Chart. We strongly encourage you to use this feature. If lab results are abnormal the clinic will contact you with the appropriate steps. If the clinic does not contact you assume the results are satisfactory. You can always view your results on My Chart. If you have questions regarding your health or results, please contact the clinic during office hours. You can also ask questions on My Chart.  We at [facility] are grateful that you chose [place] to provide your care. We strive to provide evidence-based and compassionate care and are always looking for feedback. If you get a survey from the clinic please complete this so we can hear your opinions.  Heart-Healthy Eating Plan Many factors influence your heart health, including eating and exercise habits. Heart health is also called coronary health. Coronary risk increases with abnormal blood fat (lipid) levels. A heart-healthy eating plan includes limiting unhealthy fats, increasing healthy fats, limiting salt (sodium) intake, and making other diet and lifestyle changes. What is my plan? Your health care provider may recommend that: You limit your fat intake to _________% or less of your total calories each day. You limit your saturated fat intake to _________% or less of your total calories each day. You limit the amount of cholesterol in your diet to less than _________ mg per day. You limit the amount of sodium in your diet to less than _________  mg per day. What are tips for following this plan? Cooking Cook foods using methods other than frying. Baking, boiling, grilling, and broiling are all good options. Other ways to reduce fat include: Removing the skin from poultry. Removing all visible fats from meats. Steaming vegetables in water or broth. Meal planning  At meals, imagine dividing your plate into fourths: Fill one-half of your plate with vegetables and green salads. Fill one-fourth of your plate with whole grains. Fill one-fourth of your plate with lean protein foods. Eat 2-4 cups of vegetables per day. One cup of vegetables equals 1 cup (91 g) broccoli or cauliflower florets, 2 medium carrots, 1 large bell pepper, 1 large sweet potato, 1 large tomato, 1 medium white potato, 2 cups (150 g) raw leafy greens. Eat 1-2 cups of fruit per day. One cup of fruit equals 1 small apple, 1 large banana, 1 cup (237 g) mixed fruit, 1 large orange,  cup (82 g) dried fruit, 1 cup (240 mL) 100% fruit juice. Eat more foods that contain soluble fiber. Examples include apples, broccoli, carrots, beans, peas, and barley. Aim to get 25-30 g of fiber per day. Increase your consumption of legumes, nuts, and seeds to 4-5 servings per week. One serving of dried beans or legumes equals  cup (90 g) cooked, 1 serving of nuts is  oz (12 almonds, 24 pistachios, or 7 walnut halves), and 1 serving of seeds equals  oz (8 g). Fats Choose healthy fats more often. Choose monounsaturated and polyunsaturated fats, such as olive and canola oils, avocado oil, flaxseeds, walnuts, almonds, and seeds. Eat  more omega-3 fats. Choose salmon, mackerel, sardines, tuna, flaxseed oil, and ground flaxseeds. Aim to eat fish at least 2 times each week. Check food labels carefully to identify foods with trans fats or high amounts of saturated fat. Limit saturated fats. These are found in animal products, such as meats, butter, and cream. Plant sources of saturated fats  include palm oil, palm kernel oil, and coconut oil. Avoid foods with partially hydrogenated oils in them. These contain trans fats. Examples are stick margarine, some tub margarines, cookies, crackers, and other baked goods. Avoid fried foods. General information Eat more home-cooked food and less restaurant, buffet, and fast food. Limit or avoid alcohol. Limit foods that are high in added sugar and simple starches such as foods made using white refined flour (white breads, pastries, sweets). Lose weight if you are overweight. Losing just 5-10% of your body weight can help your overall health and prevent diseases such as diabetes and heart disease. Monitor your sodium intake, especially if you have high blood pressure. Talk with your health care provider about your sodium intake. Try to incorporate more vegetarian meals weekly. What foods should I eat? Fruits All fresh, canned (in natural juice), or frozen fruits. Vegetables Fresh or frozen vegetables (raw, steamed, roasted, or grilled). Green salads. Grains Most grains. Choose whole wheat and whole grains most of the time. Rice and pasta, including brown rice and pastas made with whole wheat. Meats and other proteins Lean, well-trimmed beef, veal, pork, and lamb. Chicken and [place] without skin. All fish and shellfish. Wild duck, rabbit, pheasant, and venison. Egg whites or low-cholesterol egg substitutes. Dried beans, peas, lentils, and tofu. Seeds and most nuts. Dairy Low-fat or nonfat cheeses, including ricotta and mozzarella. Skim or 1% milk (liquid, powdered, or evaporated). Buttermilk made with low-fat milk. Nonfat or low-fat yogurt. Fats and oils Non-hydrogenated (trans-free) margarines. Vegetable oils, including soybean, sesame, sunflower, olive, avocado, peanut, safflower, corn, canola, and cottonseed. Salad dressings or mayonnaise made with a vegetable oil. Beverages Water (mineral or sparkling). Coffee and tea. Unsweetened ice  tea. Diet beverages. Sweets and desserts Sherbet, gelatin, and fruit ice. Small amounts of dark chocolate. Limit all sweets and desserts. Seasonings and condiments All seasonings and condiments. The items listed above may not be a complete list of foods and beverages you can eat. Contact a dietitian for more options. What foods should I avoid? Fruits Canned fruit in heavy syrup. Fruit in cream or butter sauce. Fried fruit. Limit coconut. Vegetables Vegetables cooked in cheese, cream, or butter sauce. Fried vegetables. Grains Breads made with saturated or trans fats, oils, or whole milk. Croissants. Sweet rolls. Donuts. High-fat crackers, such as cheese crackers and chips. Meats and other proteins Fatty meats, such as hot dogs, ribs, sausage, bacon, rib-eye roast or steak. High-fat deli meats, such as salami and bologna. Caviar. Domestic duck and goose. Organ meats, such as liver. Dairy Cream, sour cream, cream cheese, and creamed cottage cheese. Whole-milk cheeses. Whole or 2% milk (liquid, evaporated, or condensed). Whole buttermilk. Cream sauce or high-fat cheese sauce. Whole-milk yogurt. Fats and oils Meat fat, or shortening. Cocoa butter, hydrogenated oils, palm oil, coconut oil, palm kernel oil. Solid fats and shortenings, including bacon fat, salt pork, lard, and butter. Nondairy cream substitutes. Salad dressings with cheese or sour cream. Beverages Regular sodas and any drinks with added sugar. Sweets and desserts Frosting. Pudding. Cookies. Cakes. Pies. Milk chocolate or white chocolate. Buttered syrups. Full-fat ice cream or ice cream drinks. The items listed above may  not be a complete list of foods and beverages to avoid. Contact a dietitian for more information. Summary Heart-healthy meal planning includes limiting unhealthy fats, increasing healthy fats, limiting salt (sodium) intake and making other diet and lifestyle changes. Lose weight if you are overweight. Losing just  5-10% of your body weight can help your overall health and prevent diseases such as diabetes and heart disease. Focus on eating a balance of foods, including fruits and vegetables, low-fat or nonfat dairy, lean protein, nuts and legumes, whole grains, and heart-healthy oils and fats. This information is not intended to replace advice given to you by your health care provider. Make sure you discuss any questions you have with your health care provider. Document Revised: 06/14/2021 Document Reviewed: 06/14/2021 Elsevier Patient Education  2024 [employer].

## 2023-12-19 ENCOUNTER — Telehealth: Payer: Self-pay | Admitting: Nurse Practitioner

## 2023-12-19 ENCOUNTER — Encounter: Payer: Self-pay | Admitting: Nurse Practitioner

## 2023-12-19 NOTE — Telephone Encounter (Signed)
 Can we please order another one and I'll sign

## 2023-12-19 NOTE — Telephone Encounter (Unsigned)
 Copied from CRM [id number]. Topic: General - Other >> Dec 19, 2023  4:41 PM [name] wrote: Reason for CRM: Patient called to state in need of a new Cpap machine. He stated his machine broke over the weekend and he has been without it for 3 days now. He is unable to get a new one without a written prescription from pcp Jolene Cannady. Patient is requesting for a prescription to be fax over to [facility] in [place]. Fax number [phone]. Also requesting a callback back to follow up on request at [phone]

## 2023-12-19 NOTE — Telephone Encounter (Signed)
 Addressing in mychart message. No action required on this message.

## 2023-12-22 ENCOUNTER — Ambulatory Visit: Payer: No Typology Code available for payment source | Admitting: Nurse Practitioner

## 2023-12-22 DIAGNOSIS — R7303 Prediabetes: Secondary | ICD-10-CM

## 2023-12-22 DIAGNOSIS — G4733 Obstructive sleep apnea (adult) (pediatric): Secondary | ICD-10-CM

## 2023-12-22 DIAGNOSIS — Z6841 Body Mass Index (BMI) 40.0 and over, adult: Secondary | ICD-10-CM

## 2023-12-22 DIAGNOSIS — I10 Essential (primary) hypertension: Secondary | ICD-10-CM

## 2023-12-27 NOTE — Telephone Encounter (Signed)
 Left message for patient. Scheduled for 8 am 12/28/2023.

## 2023-12-28 ENCOUNTER — Telehealth (INDEPENDENT_AMBULATORY_CARE_PROVIDER_SITE_OTHER): Admitting: Nurse Practitioner

## 2023-12-28 ENCOUNTER — Encounter: Payer: Self-pay | Admitting: Nurse Practitioner

## 2023-12-28 DIAGNOSIS — G4733 Obstructive sleep apnea (adult) (pediatric): Secondary | ICD-10-CM

## 2023-12-28 NOTE — Patient Instructions (Signed)
 Living With Sleep Apnea Sleep apnea is a condition that affects your breathing while you're sleeping. Your tongue or the tissue in your throat may block the flow of air while you sleep. You may have shallow breathing or stop breathing for short periods of time. The breaks in breathing interrupt the deep sleep that you need to feel rested. Even if you don't wake up from the gaps in breathing, you may feel tired during the day. People with sleep apnea may snore loudly. You may have a headache in the morning and feel anxious or depressed. How can sleep apnea affect me? Sleep apnea increases your chances of being very tired during the day. This is called daytime fatigue. Sleep apnea can also increase your risk of: Heart attack. Stroke. Obesity. Type 2 diabetes. Heart failure. Irregular heartbeat. High blood pressure. If you are very tired during the day, you may be more likely to: Not do well in school or at work. Fall asleep while driving. Have trouble paying attention. Develop depression or anxiety. Have problems having sex. This is called sexual dysfunction. What actions can I take to manage sleep apnea? Sleep apnea treatment  If you were given a device to open your airway while you sleep, use it only as told by your health care provider. You may be given: An oral appliance. This is a mouthpiece that shifts your lower jaw forward. A continuous positive airway pressure (CPAP) device. This blows air through a mask. A nasal expiratory positive airway pressure (EPAP) device. This has valves that you put into each nostril. A bi-level positive airway pressure (BIPAP) device. This blows air through a mask when you breathe in and breathe out. You may need surgery if other treatments don't work for you. Sleep habits Go to sleep and wake up at the same time every day. This helps set your internal clock for sleeping. If you stay up later than usual on weekends, try to get up in the morning within 2  hours of the time you usually wake up. Try to get at least 7-9 hours of sleep each night. Stop using a computer, tablet, and mobile phone a few hours before bedtime. Do not take long naps during the day. If you nap, limit it to 30 minutes. Have a relaxing bedtime routine. Reading or listening to music may relax you and help you sleep. Use your bedroom only for sleep. Keep your television and computer out of your bedroom. Keep your bedroom cool, dark, and quiet. Use a supportive mattress and pillows. Follow your provider's instructions for other changes to sleep habits. Nutrition Do not eat big meals in the evening. Do not have caffeine in the later part of the day. The effects of caffeine can last for more than 5 hours. Follow your provider's instructions for any changes to what you eat and drink. Lifestyle Do not drink alcohol before bedtime. Alcohol can cause you to fall asleep at first, but then it can cause you to wake up in the middle of the night and have trouble getting back to sleep. Do not smoke, vape, or use nicotine or tobacco. Medicines Take over-the-counter and prescription medicines only as told by your provider. Do not use over-the-counter sleep medicine. You may become dependent on this medicine, and it can make sleep apnea worse. Do not take medicines, such as sedatives and narcotics, unless told to by your provider. Activity Exercise on most days, but avoid exercising in the evening. Exercising near bedtime can interfere with sleeping.  If possible, spend time outside every day. Natural light helps with your internal clock. General information Lose weight if you need to. Stay at a healthy weight. If you are having surgery, make sure to tell your provider that you have sleep apnea. You may need to bring your device with you. Keep all follow-up visits. Your provider will want to check on your condition. Where to find more information National Heart, Lung, and Blood  Institute: [employer] This information is not intended to replace advice given to you by your health care provider. Make sure you discuss any questions you have with your health care provider. Document Revised: 08/31/2022 Document Reviewed: 08/31/2022 Elsevier Patient Education  2024 [employer].

## 2023-12-28 NOTE — Assessment & Plan Note (Signed)
 Chronic, ongoing.  Consistently uses CPAP 100% of the time and without it, as at present, he has a poor sleep pattern.  In need of new machine as current one is no longer working.  Will work on obtaining this for patient.

## 2023-12-28 NOTE — Progress Notes (Signed)
 There were no vitals taken for this visit.   Subjective:    Patient ID: Jonathan Sherman, male    DOB: 05/11/1986, 39 y.o.   MRN: [id number]  HPI: Jonathan Sherman is a 38 y.o. male  Chief Complaint  Patient presents with   Sleep Apnea    Uses cpap 100% of the time-   Virtual Visit via Video Note  I connected with Jonathan Sherman on 12/28/23 at  8:00 AM EDT by a video enabled telemedicine application and verified that I am speaking with the correct person using two identifiers.  Location: Patient: home Provider: work   I discussed the limitations of evaluation and management by telemedicine and the availability of in person appointments. The patient expressed understanding and agreed to proceed.  I discussed the assessment and treatment plan with the patient. The patient was provided an opportunity to ask questions and all were answered. The patient agreed with the plan and demonstrated an understanding of the instructions.   The patient was advised to call back or seek an in-person evaluation if the symptoms worsen or if the condition fails to improve as anticipated.  I provided 25 minutes of non-face-to-face time during this encounter.   Phillip Maffei T Jentry Mcqueary, NP   SLEEP APNEA Presents today to obtain new equipment for CPAP, has used for almost 3 years.  Needs a new machine as his current one stopped working. Has not had a good sleep in over a week due to current machine not working. Sleep apnea status: controlled Duration: chronic Satisfied with current treatment?:  yes CPAP use:  yes Sleep quality with CPAP use: good, average Treament compliance:good compliance Last sleep study: 3 years Treatments attempted:  Wakes feeling refreshed:  when uses CPAP Daytime hypersomnolence:  no Fatigue:  normal every day fatigue Insomnia:  no Good sleep hygiene:  yes Difficulty falling asleep:  no Difficulty staying asleep:  no Snoring bothers bed partner:  no Observed apnea by bed partner:  no Obesity:  yes Hypertension: yes Pulmonary hypertension:  no Coronary artery disease:  no  Relevant past medical, surgical, family and social history reviewed and updated as indicated. Interim medical history since our last visit reviewed. Allergies and medications reviewed and updated.  Review of Systems  Constitutional:  Negative for activity change, diaphoresis, fatigue and fever.  Respiratory:  Negative for cough, chest tightness, shortness of breath and wheezing.   Cardiovascular:  Negative for chest pain, palpitations and leg swelling.  Neurological: Negative.   Psychiatric/Behavioral: Negative.      Per HPI unless specifically indicated above     Objective:    There were no vitals taken for this visit.  Wt Readings from Last 3 Encounters:  08/24/23 (!) 406 lb (184.2 kg)  06/21/23 (!) 399 lb 12.8 oz (181.3 kg)  05/09/23 (!) 402 lb 6.4 oz (182.5 kg)    Physical Exam Vitals and nursing note reviewed.  Constitutional:      General: He is awake. He is not in acute distress.    Appearance: He is well-developed and well-groomed. He is obese. He is not ill-appearing or toxic-appearing.  HENT:     Head: Normocephalic.     Right Ear: Hearing normal. No drainage.     Left Ear: Hearing normal. No drainage.  Eyes:     General: Lids are normal.        Right eye: No discharge.        Left eye: No discharge.  Conjunctiva/sclera: Conjunctivae normal.  Pulmonary:     Effort: Pulmonary effort is normal. No accessory muscle usage or respiratory distress.  Musculoskeletal:     Cervical back: Normal range of motion.  Neurological:     Mental Status: He is alert and oriented to person, place, and time.  Psychiatric:        Mood and Affect: Mood normal.        Behavior: Behavior normal. Behavior is cooperative.        Thought Content: Thought content normal.        Judgment: Judgment normal.    Results for orders placed or performed in visit on 06/21/23  Bayer DCA Hb A1c  Waived   Collection Time: 06/21/23  3:50 PM  Result Value Ref Range   HB A1C (BAYER DCA - WAIVED) 5.9 (H) 4.8 - 5.6 %  Microalbumin, Urine Waived   Collection Time: 06/21/23  3:50 PM  Result Value Ref Range   Microalb, Ur Waived 30 (H) 0 - 19 mg/L   Creatinine, Urine Waived 200 10 - 300 mg/dL   Microalb/Creat Ratio <30 <30 mg/g  Comprehensive metabolic panel   Collection Time: 06/21/23  3:50 PM  Result Value Ref Range   Glucose 74 70 - 99 mg/dL   BUN 13 6 - 20 mg/dL   Creatinine, Ser 9.16 0.76 - 1.27 mg/dL   eGFR 882 >40 fO/fpw/8.26   BUN/Creatinine Ratio 16 9 - 20   Sodium 141 134 - 144 mmol/L   Potassium 4.5 3.5 - 5.2 mmol/L   Chloride 100 96 - 106 mmol/L   CO2 22 20 - 29 mmol/L   Calcium 9.9 8.7 - 10.2 mg/dL   Total Protein 7.7 6.0 - 8.5 g/dL   Albumin 4.8 4.1 - 5.1 g/dL   Globulin, Total 2.9 1.5 - 4.5 g/dL   Bilirubin Total 0.5 0.0 - 1.2 mg/dL   Alkaline Phosphatase 69 44 - 121 IU/L   AST 42 (H) 0 - 40 IU/L   ALT 50 (H) 0 - 44 IU/L  Lipid Panel w/o Chol/HDL Ratio   Collection Time: 06/21/23  3:50 PM  Result Value Ref Range   Cholesterol, Total 195 100 - 199 mg/dL   Triglycerides 760 (H) 0 - 149 mg/dL   HDL 41 >60 mg/dL   VLDL Cholesterol Cal 42 (H) 5 - 40 mg/dL   LDL Chol Calc (NIH) 887 (H) 0 - 99 mg/dL      Assessment & Plan:   Problem List Items Addressed This Visit       Respiratory   OSA on CPAP - Primary   Chronic, ongoing.  Consistently uses CPAP 100% of the time and without it, as at present, he has a poor sleep pattern.  In need of new machine as current one is no longer working.  Will work on obtaining this for patient.        Follow up plan: Return if symptoms worsen or fail to improve.

## 2023-12-28 NOTE — Telephone Encounter (Signed)
 Scheduled DOT and OV

## 2024-01-02 ENCOUNTER — Ambulatory Visit: Admitting: Podiatry

## 2024-01-02 DIAGNOSIS — Q666 Other congenital valgus deformities of feet: Secondary | ICD-10-CM

## 2024-01-02 DIAGNOSIS — M7752 Other enthesopathy of left foot: Secondary | ICD-10-CM | POA: Diagnosis not present

## 2024-01-02 DIAGNOSIS — M778 Other enthesopathies, not elsewhere classified: Secondary | ICD-10-CM

## 2024-01-02 NOTE — Progress Notes (Signed)
 Subjective:  Patient ID: Jonathan Sherman, male    DOB: [birth year],  MRN: [id number]  Chief Complaint  Patient presents with   Foot Pain    38 y.o. male presents with the above complaint.  Patient presents with complaint of left dorsal midfoot pain.  Patient states the painful touch is progressive gotten worse worse with ambulation is with pressure he states been going for quite some time.  He states is been on for few months pain scale 7 out of 10 dull achy in nature he would like to do a steroid injection he would also like to discuss orthotics option as well he has not seen anyone else prior to seeing me for this.   Review of Systems: Negative except as noted in the HPI. Denies N/V/F/Ch.  No past medical history on file.  Current Outpatient Medications:    FOLIC ACID PO, Take 300 mg by mouth daily. Take 2 tablets by mouth daily, Disp: , Rfl:    gabapentin (NEURONTIN) 300 MG capsule, Take 300 mg by mouth., Disp: , Rfl:    magnesium oxide (MAG-OX) 400 (240 Mg) MG tablet, Take 400 mg by mouth daily., Disp: , Rfl:    meloxicam (MOBIC) 7.5 MG tablet, Take 7.5 mg by mouth daily., Disp: , Rfl:    olmesartan  (BENICAR ) 20 MG tablet, Take 0.5 tablets (10 mg total) by mouth daily., Disp: 45 tablet, Rfl: 2   tamsulosin  (FLOMAX ) 0.4 MG CAPS capsule, Take 0.4 mg by mouth daily., Disp: , Rfl:    Vitamin D, Ergocalciferol, (DRISDOL) 1.25 MG (50000 UNIT) CAPS capsule, Take 50,000 Units by mouth once a week., Disp: , Rfl:   Social History   Tobacco Use  Smoking Status Never  Smokeless Tobacco Never    No Known Allergies Objective:  There were no vitals filed for this visit. There is no height or weight on file to calculate BMI. Constitutional Well developed. Well nourished.  Vascular Dorsalis pedis pulses palpable bilaterally. Posterior tibial pulses palpable bilaterally. Capillary refill normal to all digits.  No cyanosis or clubbing noted. Pedal hair growth normal.  Neurologic Normal  speech. Oriented to person, place, and time. Epicritic sensation to light touch grossly present bilaterally.  Dermatologic Nails well groomed and normal in appearance. No open wounds. No skin lesions.  Orthopedic: Pain on palpation to the left foot extensor tendinitis.  Pain with resisted dorsiflexion of the toes no pain with plantarflexion of the toes.  Pain in the dorsal forefoot.  No pain to the midfoot.  No other abnormalities identified.   Radiographs: None Assessment:   1. Extensor tendinitis of foot   2. Pes planovalgus    Plan:  Patient was evaluated and treated and all questions answered.  Left dorsal foot extensor tendinitis - All questions and concerns were discussed with the patient in extensive detail given the amount of pain that she is having he will benefit from steroid injection help decreasing inflammatory, surgical pain.  Patient agrees with plan to proceed with steroid injection.  He will also benefit from a cam boot.  He is lost his previous pair of cam boot he would like to do another pair has not seen MRIs prior to seeing me.  Pes planovalgus -I explained to patient the etiology of pes planovalgus and relationship with Planter fasciitis and various treatment options were discussed.  Given patient foot structure in the setting of Planter fasciitis I believe patient will benefit from custom-made orthotics to help control the hindfoot motion support the  arch of the foot and take the stress away from plantar fascial.  Patient agrees with the plan like to proceed with orthotics -Patient was casted for orthotics   No follow-ups on file.

## 2024-01-21 DIAGNOSIS — E7800 Pure hypercholesterolemia, unspecified: Secondary | ICD-10-CM | POA: Insufficient documentation

## 2024-01-21 NOTE — Patient Instructions (Signed)
 Be Involved in Caring For Your Health:  Taking Medications When medications are taken as directed, they can greatly improve your health. But if they are not taken as prescribed, they may not work. In some cases, not taking them correctly can be harmful. To help ensure your treatment remains effective and safe, understand your medications and how to take them. Bring your medications to each visit for review by your provider.  Your lab results, notes, and after visit summary will be available on My Chart. We strongly encourage you to use this feature. If lab results are abnormal the clinic will contact you with the appropriate steps. If the clinic does not contact you assume the results are satisfactory. You can always view your results on My Chart. If you have questions regarding your health or results, please contact the clinic during office hours. You can also ask questions on My Chart.  We at [facility] are grateful that you chose [place] to provide your care. We strive to provide evidence-based and compassionate care and are always looking for feedback. If you get a survey from the clinic please complete this so we can hear your opinions.  Heart-Healthy Eating Plan Many factors influence your heart health, including eating and exercise habits. Heart health is also called coronary health. Coronary risk increases with abnormal blood fat (lipid) levels. A heart-healthy eating plan includes limiting unhealthy fats, increasing healthy fats, limiting salt (sodium) intake, and making other diet and lifestyle changes. What is my plan? Your health care provider may recommend that: You limit your fat intake to _________% or less of your total calories each day. You limit your saturated fat intake to _________% or less of your total calories each day. You limit the amount of cholesterol in your diet to less than _________ mg per day. You limit the amount of sodium in your diet to less than _________  mg per day. What are tips for following this plan? Cooking Cook foods using methods other than frying. Baking, boiling, grilling, and broiling are all good options. Other ways to reduce fat include: Removing the skin from poultry. Removing all visible fats from meats. Steaming vegetables in water or broth. Meal planning  At meals, imagine dividing your plate into fourths: Fill one-half of your plate with vegetables and green salads. Fill one-fourth of your plate with whole grains. Fill one-fourth of your plate with lean protein foods. Eat 2-4 cups of vegetables per day. One cup of vegetables equals 1 cup (91 g) broccoli or cauliflower florets, 2 medium carrots, 1 large bell pepper, 1 large sweet potato, 1 large tomato, 1 medium white potato, 2 cups (150 g) raw leafy greens. Eat 1-2 cups of fruit per day. One cup of fruit equals 1 small apple, 1 large banana, 1 cup (237 g) mixed fruit, 1 large orange,  cup (82 g) dried fruit, 1 cup (240 mL) 100% fruit juice. Eat more foods that contain soluble fiber. Examples include apples, broccoli, carrots, beans, peas, and barley. Aim to get 25-30 g of fiber per day. Increase your consumption of legumes, nuts, and seeds to 4-5 servings per week. One serving of dried beans or legumes equals  cup (90 g) cooked, 1 serving of nuts is  oz (12 almonds, 24 pistachios, or 7 walnut halves), and 1 serving of seeds equals  oz (8 g). Fats Choose healthy fats more often. Choose monounsaturated and polyunsaturated fats, such as olive and canola oils, avocado oil, flaxseeds, walnuts, almonds, and seeds. Eat  more omega-3 fats. Choose salmon, mackerel, sardines, tuna, flaxseed oil, and ground flaxseeds. Aim to eat fish at least 2 times each week. Check food labels carefully to identify foods with trans fats or high amounts of saturated fat. Limit saturated fats. These are found in animal products, such as meats, butter, and cream. Plant sources of saturated fats  include palm oil, palm kernel oil, and coconut oil. Avoid foods with partially hydrogenated oils in them. These contain trans fats. Examples are stick margarine, some tub margarines, cookies, crackers, and other baked goods. Avoid fried foods. General information Eat more home-cooked food and less restaurant, buffet, and fast food. Limit or avoid alcohol. Limit foods that are high in added sugar and simple starches such as foods made using white refined flour (white breads, pastries, sweets). Lose weight if you are overweight. Losing just 5-10% of your body weight can help your overall health and prevent diseases such as diabetes and heart disease. Monitor your sodium intake, especially if you have high blood pressure. Talk with your health care provider about your sodium intake. Try to incorporate more vegetarian meals weekly. What foods should I eat? Fruits All fresh, canned (in natural juice), or frozen fruits. Vegetables Fresh or frozen vegetables (raw, steamed, roasted, or grilled). Green salads. Grains Most grains. Choose whole wheat and whole grains most of the time. Rice and pasta, including brown rice and pastas made with whole wheat. Meats and other proteins Lean, well-trimmed beef, veal, pork, and lamb. Chicken and [place] without skin. All fish and shellfish. Wild duck, rabbit, pheasant, and venison. Egg whites or low-cholesterol egg substitutes. Dried beans, peas, lentils, and tofu. Seeds and most nuts. Dairy Low-fat or nonfat cheeses, including ricotta and mozzarella. Skim or 1% milk (liquid, powdered, or evaporated). Buttermilk made with low-fat milk. Nonfat or low-fat yogurt. Fats and oils Non-hydrogenated (trans-free) margarines. Vegetable oils, including soybean, sesame, sunflower, olive, avocado, peanut, safflower, corn, canola, and cottonseed. Salad dressings or mayonnaise made with a vegetable oil. Beverages Water (mineral or sparkling). Coffee and tea. Unsweetened ice  tea. Diet beverages. Sweets and desserts Sherbet, gelatin, and fruit ice. Small amounts of dark chocolate. Limit all sweets and desserts. Seasonings and condiments All seasonings and condiments. The items listed above may not be a complete list of foods and beverages you can eat. Contact a dietitian for more options. What foods should I avoid? Fruits Canned fruit in heavy syrup. Fruit in cream or butter sauce. Fried fruit. Limit coconut. Vegetables Vegetables cooked in cheese, cream, or butter sauce. Fried vegetables. Grains Breads made with saturated or trans fats, oils, or whole milk. Croissants. Sweet rolls. Donuts. High-fat crackers, such as cheese crackers and chips. Meats and other proteins Fatty meats, such as hot dogs, ribs, sausage, bacon, rib-eye roast or steak. High-fat deli meats, such as salami and bologna. Caviar. Domestic duck and goose. Organ meats, such as liver. Dairy Cream, sour cream, cream cheese, and creamed cottage cheese. Whole-milk cheeses. Whole or 2% milk (liquid, evaporated, or condensed). Whole buttermilk. Cream sauce or high-fat cheese sauce. Whole-milk yogurt. Fats and oils Meat fat, or shortening. Cocoa butter, hydrogenated oils, palm oil, coconut oil, palm kernel oil. Solid fats and shortenings, including bacon fat, salt pork, lard, and butter. Nondairy cream substitutes. Salad dressings with cheese or sour cream. Beverages Regular sodas and any drinks with added sugar. Sweets and desserts Frosting. Pudding. Cookies. Cakes. Pies. Milk chocolate or white chocolate. Buttered syrups. Full-fat ice cream or ice cream drinks. The items listed above may  not be a complete list of foods and beverages to avoid. Contact a dietitian for more information. Summary Heart-healthy meal planning includes limiting unhealthy fats, increasing healthy fats, limiting salt (sodium) intake and making other diet and lifestyle changes. Lose weight if you are overweight. Losing just  5-10% of your body weight can help your overall health and prevent diseases such as diabetes and heart disease. Focus on eating a balance of foods, including fruits and vegetables, low-fat or nonfat dairy, lean protein, nuts and legumes, whole grains, and heart-healthy oils and fats. This information is not intended to replace advice given to you by your health care provider. Make sure you discuss any questions you have with your health care provider. Document Revised: 06/14/2021 Document Reviewed: 06/14/2021 Elsevier Patient Education  2024 [employer].

## 2024-01-24 ENCOUNTER — Ambulatory Visit: Payer: Self-pay | Admitting: Nurse Practitioner

## 2024-01-24 ENCOUNTER — Encounter: Payer: Self-pay | Admitting: Nurse Practitioner

## 2024-01-24 ENCOUNTER — Ambulatory Visit: Admitting: Nurse Practitioner

## 2024-01-24 VITALS — BP 128/80 | HR 96 | Temp 98.1°F | Resp 17 | Ht 73.0 in | Wt >= 6400 oz

## 2024-01-24 DIAGNOSIS — E66813 Obesity, class 3: Secondary | ICD-10-CM

## 2024-01-24 DIAGNOSIS — R1084 Generalized abdominal pain: Secondary | ICD-10-CM | POA: Insufficient documentation

## 2024-01-24 DIAGNOSIS — G4733 Obstructive sleep apnea (adult) (pediatric): Secondary | ICD-10-CM

## 2024-01-24 DIAGNOSIS — I10 Essential (primary) hypertension: Secondary | ICD-10-CM | POA: Diagnosis not present

## 2024-01-24 DIAGNOSIS — R7303 Prediabetes: Secondary | ICD-10-CM | POA: Diagnosis not present

## 2024-01-24 DIAGNOSIS — E7800 Pure hypercholesterolemia, unspecified: Secondary | ICD-10-CM

## 2024-01-24 DIAGNOSIS — Z6841 Body Mass Index (BMI) 40.0 and over, adult: Secondary | ICD-10-CM

## 2024-01-24 LAB — BAYER DCA HB A1C WAIVED: HB A1C (BAYER DCA - WAIVED): 6.2 % — ABNORMAL HIGH (ref 4.8–5.6)

## 2024-01-24 MED ORDER — OLMESARTAN MEDOXOMIL 20 MG PO TABS: 10.0000 mg | ORAL_TABLET | Freq: Every day | ORAL | 3 refills | Status: AC

## 2024-01-24 NOTE — Progress Notes (Signed)
 Contacted via MyChart  A1c trended up just a little to 6.2%, did you have any steroid injections or oral steroids recently with tendonitis?  This can explain the slight trend up and we will recheck in 3 to 6 months.:)

## 2024-01-24 NOTE — Assessment & Plan Note (Signed)
 Chronic, ongoing.  Consistently uses CPAP 100% of the time and without it, as at present, he has a poor sleep pattern.  In need of new machine as current one is no longer working.  Will check on order that was sent 12/28/23.

## 2024-01-24 NOTE — Addendum Note (Signed)
 Addended by: Jeannett Dekoning T on: 01/24/2024 02:32 PM   Modules accepted: Orders

## 2024-01-24 NOTE — Assessment & Plan Note (Signed)
 Ongoing, but BP is trending down to goal range on recheck.  Continue Olmesartan  10 MG daily and adjust as needed.  Educated him on ARB family and side effects. Recommend he monitor BP at least a few mornings a week at home and document.  DASH diet at home.  Labs today: CBC, TSH, CMP.

## 2024-01-24 NOTE — Assessment & Plan Note (Signed)
 BMI 54.36.  Recommended eating smaller high protein, low fat meals more frequently and exercising 30 mins a day 5 times a week with a goal of 10-15lb weight loss in the next 3 months. Patient voiced their understanding and motivation to adhere to these recommendations.  Lab check today.

## 2024-01-24 NOTE — Assessment & Plan Note (Signed)
 Chronic, ongoing.  Check levels today and continue focus on healthy diet changes and regular exercise.

## 2024-01-24 NOTE — Assessment & Plan Note (Signed)
 A1c 5.9% last check.  Will continue to monitor closely due to significant family history.  He is aware what symptoms to report immediately to provider.

## 2024-01-24 NOTE — Progress Notes (Signed)
 BP 128/80 (BP Location: Left Arm)   Pulse 96   Temp 98.1 F (36.7 C) (Oral)   Resp 17   Ht 6' 1 (1.854 m)   Wt (!) 412 lb (186.9 kg)   SpO2 98%   BMI 54.36 kg/m    Subjective:    Patient ID: Jonathan Sherman, male    DOB: [birth year], 38 y.o.   MRN: [id number]  HPI: Jonathan Sherman is a 37 y.o. male  Chief Complaint  Patient presents with   Hypertension   Hyperlipidemia   HYPERTENSION without Chronic Kidney Disease Continues on Olmesartan  10 MG. CPAP is not working at this time and new supplies ordered on 12/28/23, but has not heard from anyone. Hypertension status: stable  Satisfied with current treatment? yes Duration of hypertension: chronic BP monitoring frequency:  not checking BP range:  BP medication side effects:  no Medication compliance: good compliance Aspirin: no Recurrent headaches: no Visual changes: no Palpitations: no Dyspnea: no Chest pain: no Lower extremity edema: no Dizzy/lightheaded: no  The ASCVD Risk score (Arnett DK, et al., 2019) failed to calculate for the following reasons:   The 2019 ASCVD risk score is only valid for ages 63 to 24  Impaired Fasting Glucose HbA1C:  Lab Results  Component Value Date   HGBA1C 5.9 (H) 06/21/2023  Duration of elevated blood sugar: chronic Polydipsia: no Polyuria: no Weight change: no Visual disturbance: no Glucose Monitoring: no    Accucheck frequency: Not Checking    Fasting glucose:     Post prandial:  Diabetic Education: Not Completed Family history of diabetes: yes   CONSTIPATION Has had issues with this for quite awhile.  They are very irregular and not very solid.Has a BM once a day, prior to this every time he ate something he had BM after gall bladder was removed -- had this out in 2018.  Feeling pressure and pain to abdominal area (midline), notices this anytime he eats.  Currently can go 2-3 meals in a day and not have BM.  No straining, but there will not be a lot and it is more diarrhea. No  family history of bowel issues or colon cancer.   Duration:months -- a few months Alleviating factors: nothing Aggravating factors: nothing Status: progressively getting worse Treatments attempted: Dulcolax Fever: no Nausea: no Vomiting: no Weight loss: no Decreased appetite: no Diarrhea: yes Constipation: no Blood in stool: no Heartburn: at times Jaundice: no Rash: no  Relevant past medical, surgical, family and social history reviewed and updated as indicated. Interim medical history since our last visit reviewed. Allergies and medications reviewed and updated.  Review of Systems  Constitutional:  Negative for activity change, diaphoresis, fatigue and fever.  Respiratory:  Negative for cough, chest tightness, shortness of breath and wheezing.   Cardiovascular:  Negative for chest pain, palpitations and leg swelling.  Gastrointestinal:  Positive for abdominal pain, constipation and diarrhea. Negative for abdominal distention, nausea and vomiting.  Endocrine: Negative for cold intolerance, heat intolerance, polydipsia, polyphagia and polyuria.  Neurological: Negative.   Psychiatric/Behavioral: Negative.      Per HPI unless specifically indicated above     Objective:    BP 128/80 (BP Location: Left Arm)   Pulse 96   Temp 98.1 F (36.7 C) (Oral)   Resp 17   Ht 6' 1 (1.854 m)   Wt (!) 412 lb (186.9 kg)   SpO2 98%   BMI 54.36 kg/m   Wt Readings from Last 3 Encounters:  01/24/24 (!) 412 lb (186.9 kg)  08/24/23 (!) 406 lb (184.2 kg)  06/21/23 (!) 399 lb 12.8 oz (181.3 kg)    Physical Exam Vitals and nursing note reviewed.  Constitutional:      General: He is awake. He is not in acute distress.    Appearance: Normal appearance. He is well-developed and well-groomed. He is obese. He is not ill-appearing or toxic-appearing.  HENT:     Head: Normocephalic.     Right Ear: Hearing and external ear normal.     Left Ear: Hearing and external ear normal.  Eyes:      General: Lids are normal.     Extraocular Movements: Extraocular movements intact.     Conjunctiva/sclera: Conjunctivae normal.  Neck:     Thyroid : No thyromegaly.     Vascular: No carotid bruit.  Cardiovascular:     Rate and Rhythm: Normal rate and regular rhythm.     Heart sounds: Normal heart sounds. No murmur heard.    No gallop.  Pulmonary:     Effort: No accessory muscle usage or respiratory distress.     Breath sounds: Normal breath sounds.  Abdominal:     General: Bowel sounds are normal. There is no distension.     Palpations: Abdomen is soft.     Tenderness: There is no abdominal tenderness. There is no right CVA tenderness or left CVA tenderness.     Hernia: A hernia is present. Hernia is present in the ventral area.   Musculoskeletal:     Cervical back: Full passive range of motion without pain.     Right lower leg: No edema.     Left lower leg: No edema.  Lymphadenopathy:     Cervical: No cervical adenopathy.  Skin:    General: Skin is warm.     Capillary Refill: Capillary refill takes less than 2 seconds.  Neurological:     Mental Status: He is alert and oriented to person, place, and time.     Deep Tendon Reflexes: Reflexes are normal and symmetric.     Reflex Scores:      Brachioradialis reflexes are 2+ on the right side and 2+ on the left side.      Patellar reflexes are 2+ on the right side and 2+ on the left side. Psychiatric:        Attention and Perception: Attention normal.        Mood and Affect: Mood normal.        Speech: Speech normal.        Behavior: Behavior normal. Behavior is cooperative.        Thought Content: Thought content normal.    Results for orders placed or performed in visit on 06/21/23  Bayer DCA Hb A1c Waived   Collection Time: 06/21/23  3:50 PM  Result Value Ref Range   HB A1C (BAYER DCA - WAIVED) 5.9 (H) 4.8 - 5.6 %  Microalbumin, Urine Waived   Collection Time: 06/21/23  3:50 PM  Result Value Ref Range   Microalb, Ur  Waived 30 (H) 0 - 19 mg/L   Creatinine, Urine Waived 200 10 - 300 mg/dL   Microalb/Creat Ratio <30 <30 mg/g  Comprehensive metabolic panel   Collection Time: 06/21/23  3:50 PM  Result Value Ref Range   Glucose 74 70 - 99 mg/dL   BUN 13 6 - 20 mg/dL   Creatinine, Ser 9.16 0.76 - 1.27 mg/dL   eGFR 882 >40 fO/fpw/8.26   BUN/Creatinine Ratio  16 9 - 20   Sodium 141 134 - 144 mmol/L   Potassium 4.5 3.5 - 5.2 mmol/L   Chloride 100 96 - 106 mmol/L   CO2 22 20 - 29 mmol/L   Calcium 9.9 8.7 - 10.2 mg/dL   Total Protein 7.7 6.0 - 8.5 g/dL   Albumin 4.8 4.1 - 5.1 g/dL   Globulin, Total 2.9 1.5 - 4.5 g/dL   Bilirubin Total 0.5 0.0 - 1.2 mg/dL   Alkaline Phosphatase 69 44 - 121 IU/L   AST 42 (H) 0 - 40 IU/L   ALT 50 (H) 0 - 44 IU/L  Lipid Panel w/o Chol/HDL Ratio   Collection Time: 06/21/23  3:50 PM  Result Value Ref Range   Cholesterol, Total 195 100 - 199 mg/dL   Triglycerides 760 (H) 0 - 149 mg/dL   HDL 41 >60 mg/dL   VLDL Cholesterol Cal 42 (H) 5 - 40 mg/dL   LDL Chol Calc (NIH) 887 (H) 0 - 99 mg/dL      Assessment & Plan:   Problem List Items Addressed This Visit       Cardiovascular and Mediastinum   Essential hypertension - Primary   Ongoing, but BP is trending down to goal range on recheck.  Continue Olmesartan  10 MG daily and adjust as needed.  Educated him on ARB family and side effects. Recommend he monitor BP at least a few mornings a week at home and document.  DASH diet at home.  Labs today: CBC, TSH, CMP.       Relevant Medications   olmesartan  (BENICAR ) 20 MG tablet   Other Relevant Orders   Basic metabolic panel with GFR   CBC with Differential/Platelet   TSH     Respiratory   OSA on CPAP   Chronic, ongoing.  Consistently uses CPAP 100% of the time and without it, as at present, he has a poor sleep pattern.  In need of new machine as current one is no longer working.  Will check on order that was sent 12/28/23.        Other   Prediabetes   A1c 5.9% last  check.  Will continue to monitor closely due to significant family history.  He is aware what symptoms to report immediately to provider.      Relevant Orders   Bayer DCA Hb A1c Waived   Generalized abdominal pain   Over past three months with change in bowel pattern.  ?hernia, will obtain imaging to further assess.  Check labs today CBC, CMP, TSH.  Recommend starting Metamucil every morning to see if benefit bowel pattern and more formed stool.  Determine all next steps once labs and imaging returns.      Relevant Orders   CT ABDOMEN PELVIS WO CONTRAST   Elevated low density lipoprotein (LDL) cholesterol level   Chronic, ongoing.  Check levels today and continue focus on healthy diet changes and regular exercise.      Relevant Orders   Lipid Panel w/o Chol/HDL Ratio   Class 3 severe obesity due to excess calories without serious comorbidity with body mass index (BMI) of 50.0 to 59.9 in adult   BMI 54.36.  Recommended eating smaller high protein, low fat meals more frequently and exercising 30 mins a day 5 times a week with a goal of 10-15lb weight loss in the next 3 months. Patient voiced their understanding and motivation to adhere to these recommendations.  Lab check today.  Follow up plan: Return in about 4 weeks (around 02/21/2024) for Abdominal Pain.

## 2024-01-24 NOTE — Assessment & Plan Note (Signed)
 Over past three months with change in bowel pattern.  ?hernia, will obtain imaging to further assess.  Check labs today CBC, CMP, TSH.  Recommend starting Metamucil every morning to see if benefit bowel pattern and more formed stool.  Determine all next steps once labs and imaging returns.

## 2024-01-30 ENCOUNTER — Ambulatory Visit: Admitting: Podiatry

## 2024-01-30 DIAGNOSIS — M7752 Other enthesopathy of left foot: Secondary | ICD-10-CM | POA: Diagnosis not present

## 2024-01-30 DIAGNOSIS — M778 Other enthesopathies, not elsewhere classified: Secondary | ICD-10-CM

## 2024-01-30 DIAGNOSIS — Q666 Other congenital valgus deformities of feet: Secondary | ICD-10-CM | POA: Diagnosis not present

## 2024-01-30 NOTE — Progress Notes (Signed)
 Subjective:  Patient ID: Jonathan Sherman, male    DOB: [birth year],  MRN: [id number]  Chief Complaint  Patient presents with   Extensor tendinitis of foot    Pt stated that he has his good days and bad ones     38 y.o. male presents with the above complaint.  Patient presents for follow-up of left dorsal extensor tendinitis.  He states is doing better cam boot and injection helped.  He still has some residual achy pain.  Overall still doing pretty good denies any other acute issues   Review of Systems: Negative except as noted in the HPI. Denies N/V/F/Ch.  No past medical history on file.  Current Outpatient Medications:    FOLIC ACID PO, Take 300 mg by mouth daily. Take 2 tablets by mouth daily, Disp: , Rfl:    gabapentin (NEURONTIN) 300 MG capsule, Take 300 mg by mouth., Disp: , Rfl:    magnesium oxide (MAG-OX) 400 (240 Mg) MG tablet, Take 400 mg by mouth daily., Disp: , Rfl:    meloxicam  (MOBIC ) 7.5 MG tablet, Take 7.5 mg by mouth daily., Disp: , Rfl:    olmesartan  (BENICAR ) 20 MG tablet, Take 0.5 tablets (10 mg total) by mouth daily., Disp: 45 tablet, Rfl: 3   tamsulosin  (FLOMAX ) 0.4 MG CAPS capsule, Take 0.4 mg by mouth daily., Disp: , Rfl:    Vitamin D, Ergocalciferol, (DRISDOL) 1.25 MG (50000 UNIT) CAPS capsule, Take 50,000 Units by mouth once a week., Disp: , Rfl:   Social History   Tobacco Use  Smoking Status Never  Smokeless Tobacco Never    No Known Allergies Objective:  There were no vitals filed for this visit. There is no height or weight on file to calculate BMI. Constitutional Well developed. Well nourished.  Vascular Dorsalis pedis pulses palpable bilaterally. Posterior tibial pulses palpable bilaterally. Capillary refill normal to all digits.  No cyanosis or clubbing noted. Pedal hair growth normal.  Neurologic Normal speech. Oriented to person, place, and time. Epicritic sensation to light touch grossly present bilaterally.  Dermatologic Nails well  groomed and normal in appearance. No open wounds. No skin lesions.  Orthopedic: Mild pain on palpation to the left foot extensor tendinitis.  Mild pain with resisted dorsiflexion of the toes no pain with plantarflexion of the toes.  Pain in the dorsal forefoot.  No pain to the midfoot.  No other abnormalities identified.   Radiographs: None Assessment:   1. Extensor tendinitis of foot   2. Pes planovalgus     Plan:  Patient was evaluated and treated and all questions answered.  Left dorsal foot extensor tendinitis - All questions and concerns were discussed with the patient in extensive detail given the amount of pain that she is having he will benefit from steroid injection help decreasing inflammatory, surgical pain.  Patient agrees with plan to proceed with steroid injection.  At this time he will discontinue the cam boot and go into regular shoes without any restrictions -A steroid injection was performed at left dorsal foot using 1% plain Lidocaine  and 10 mg of Kenalog . This was well tolerated.   Pes planovalgus -I explained to patient the etiology of pes planovalgus and relationship with Planter fasciitis and various treatment options were discussed.  Given patient foot structure in the setting of Planter fasciitis I believe patient will benefit from custom-made orthotics to help control the hindfoot motion support the arch of the foot and take the stress away from plantar fascial.  Patient agrees with  the plan like to proceed with orthotics - Patient is still awaiting orthotics   No follow-ups on file.

## 2024-01-31 ENCOUNTER — Other Ambulatory Visit

## 2024-01-31 DIAGNOSIS — R200 Anesthesia of skin: Secondary | ICD-10-CM

## 2024-01-31 DIAGNOSIS — E7800 Pure hypercholesterolemia, unspecified: Secondary | ICD-10-CM

## 2024-01-31 DIAGNOSIS — R1084 Generalized abdominal pain: Secondary | ICD-10-CM

## 2024-01-31 DIAGNOSIS — Z13220 Encounter for screening for lipoid disorders: Secondary | ICD-10-CM

## 2024-01-31 DIAGNOSIS — I10 Essential (primary) hypertension: Secondary | ICD-10-CM

## 2024-02-01 ENCOUNTER — Telehealth: Payer: Self-pay

## 2024-02-01 LAB — BASIC METABOLIC PANEL WITH GFR
BUN/Creatinine Ratio: 16 (ref 9–20)
BUN: 13 mg/dL (ref 6–20)
CO2: 21 mmol/L (ref 20–29)
Calcium: 9.1 mg/dL (ref 8.7–10.2)
Chloride: 100 mmol/L (ref 96–106)
Creatinine, Ser: 0.8 mg/dL (ref 0.76–1.27)
Glucose: 161 mg/dL — ABNORMAL HIGH (ref 70–99)
Potassium: 4 mmol/L (ref 3.5–5.2)
Sodium: 138 mmol/L (ref 134–144)
eGFR: 118 mL/min/1.73

## 2024-02-01 LAB — CBC WITH DIFFERENTIAL/PLATELET
Basophils Absolute: 0 x10E3/uL (ref 0.0–0.2)
Basos: 0 %
EOS (ABSOLUTE): 0.1 x10E3/uL (ref 0.0–0.4)
Eos: 1 %
Hematocrit: 45.1 % (ref 37.5–51.0)
Hemoglobin: 14.9 g/dL (ref 13.0–17.7)
Immature Grans (Abs): 0 x10E3/uL (ref 0.0–0.1)
Immature Granulocytes: 0 %
Lymphocytes Absolute: 1.9 x10E3/uL (ref 0.7–3.1)
Lymphs: 23 %
MCH: 29 pg (ref 26.6–33.0)
MCHC: 33 g/dL (ref 31.5–35.7)
MCV: 88 fL (ref 79–97)
Monocytes Absolute: 0.4 x10E3/uL (ref 0.1–0.9)
Monocytes: 5 %
Neutrophils Absolute: 5.7 x10E3/uL (ref 1.4–7.0)
Neutrophils: 70 %
Platelets: 223 x10E3/uL (ref 150–450)
RBC: 5.13 x10E6/uL (ref 4.14–5.80)
RDW: 13.2 % (ref 11.6–15.4)
WBC: 8.1 x10E3/uL (ref 3.4–10.8)

## 2024-02-01 LAB — LIPID PANEL W/O CHOL/HDL RATIO
Cholesterol, Total: 197 mg/dL (ref 100–199)
HDL: 40 mg/dL (ref >39)
LDL Chol Calc (NIH): 123 mg/dL — ABNORMAL HIGH (ref 0–99)
Triglycerides: 192 mg/dL — ABNORMAL HIGH (ref 0–149)
VLDL Cholesterol Cal: 34 mg/dL (ref 5–40)

## 2024-02-01 LAB — TSH: TSH: 4.06 u[IU]/mL (ref 0.450–4.500)

## 2024-02-01 NOTE — Telephone Encounter (Signed)
 LVM to schedule orthotic fitting/ pu  Charges pending

## 2024-02-01 NOTE — Telephone Encounter (Signed)
 Orthotics in [place] bin

## 2024-02-01 NOTE — Progress Notes (Signed)
 Contacted via MyChart  Good evening Jonathan Sherman, did you eat before these labs? Your sugar level (glucose) is quite elevated, although your recent A1c remained in prediabetic range.  We will keep a close eye on this.  LDL, bad cholesterol, remains elevated.  Continue to focus heavily on health diet changes and regular activity.  Remainder of current labs stable.  Any questions?  Keep being amazing!!  Thank you for allowing me to participate in your care.  I appreciate you. Kindest regards, [place] Oborn

## 2024-02-02 ENCOUNTER — Other Ambulatory Visit: Payer: Self-pay | Admitting: Podiatry

## 2024-02-02 ENCOUNTER — Telehealth: Payer: Self-pay | Admitting: Lab

## 2024-02-02 MED ORDER — MELOXICAM 15 MG PO TABS
15.0000 mg | ORAL_TABLET | Freq: Every day | ORAL | 0 refills | Status: DC
Start: 2024-02-02 — End: 2024-04-08

## 2024-02-02 MED ORDER — METHYLPREDNISOLONE 4 MG PO TBPK: ORAL_TABLET | ORAL | 0 refills | Status: DC

## 2024-02-02 NOTE — Telephone Encounter (Signed)
 Patient calling states his medication from his visit was not called in to pharmacy.

## 2024-02-09 ENCOUNTER — Ambulatory Visit

## 2024-02-09 DIAGNOSIS — M7672 Peroneal tendinitis, left leg: Secondary | ICD-10-CM

## 2024-02-09 DIAGNOSIS — M2142 Flat foot [pes planus] (acquired), left foot: Secondary | ICD-10-CM

## 2024-02-09 DIAGNOSIS — M7671 Peroneal tendinitis, right leg: Secondary | ICD-10-CM

## 2024-02-12 NOTE — Progress Notes (Signed)
 Patient presents today to pick up custom molded foot orthotics, diagnosed with Peroneal tendonitis and pes planus by Dr. Tobie.   Orthotics were dispensed and fit was satisfactory. Reviewed instructions for break-in and wear. Written instructions given to patient.  Patient will follow up as needed.   Lolita Schultze CPed

## 2024-02-13 LAB — TESTOSTERONE, FREE, TOTAL, SHBG

## 2024-02-13 LAB — SPECIMEN STATUS REPORT

## 2024-02-23 ENCOUNTER — Ambulatory Visit: Admitting: Nurse Practitioner

## 2024-02-24 NOTE — Patient Instructions (Incomplete)
 Be Involved in Caring For Your Health:  Taking Medications When medications are taken as directed, they can greatly improve your health. But if they are not taken as prescribed, they may not work. In some cases, not taking them correctly can be harmful. To help ensure your treatment remains effective and safe, understand your medications and how to take them. Bring your medications to each visit for review by your provider.  Your lab results, notes, and after visit summary will be available on My Chart. We strongly encourage you to use this feature. If lab results are abnormal the clinic will contact you with the appropriate steps. If the clinic does not contact you assume the results are satisfactory. You can always view your results on My Chart. If you have questions regarding your health or results, please contact the clinic during office hours. You can also ask questions on My Chart.  We at [facility] are grateful that you chose us  to provide your care. We strive to provide evidence-based and compassionate care and are always looking for feedback. If you get a survey from the clinic please complete this so we can hear your opinions.  Healthy Eating, Adult Healthy eating may help you get and keep a healthy body weight, reduce the risk of chronic disease, and live a long and productive life. It is important to follow a healthy eating pattern. Your nutritional and calorie needs should be met mainly by different nutrient-rich foods. What are tips for following this plan? Reading food labels Read labels and choose the following: Reduced or low sodium products. Juices with 100% fruit juice. Foods with low saturated fats (<3 g per serving) and high polyunsaturated and monounsaturated fats. Foods with whole grains, such as whole wheat, cracked wheat, brown rice, and wild rice. Whole grains that are fortified with folic acid. This is recommended for females who are pregnant or who want to  become pregnant. Read labels and do not eat or drink the following: Foods or drinks with added sugars. These include foods that contain brown sugar, corn sweetener, corn syrup, dextrose , fructose, glucose, high-fructose corn syrup, honey, invert sugar, lactose, malt syrup, maltose, molasses, raw sugar, sucrose, trehalose, or turbinado sugar. Limit your intake of added sugars to less than 10% of your total daily calories. Do not eat more than the following amounts of added sugar per day: 6 teaspoons (25 g) for females. 9 teaspoons (38 g) for males. Foods that contain processed or refined starches and grains. Refined grain products, such as white flour, degermed cornmeal, white bread, and white rice. Shopping Choose nutrient-rich snacks, such as vegetables, whole fruits, and nuts. Avoid high-calorie and high-sugar snacks, such as potato chips, fruit snacks, and candy. Use oil-based dressings and spreads on foods instead of solid fats such as butter, margarine, sour cream, or cream cheese. Limit pre-made sauces, mixes, and instant products such as flavored rice, instant noodles, and ready-made pasta. Try more plant-protein sources, such as tofu, tempeh, black beans, edamame, lentils, nuts, and seeds. Explore eating plans such as the Mediterranean diet or vegetarian diet. Try heart-healthy dips made with beans and healthy fats like hummus and guacamole. Vegetables go great with these. Cooking Use oil to saut or stir-fry foods instead of solid fats such as butter, margarine, or lard. Try baking, boiling, grilling, or broiling instead of frying. Remove the fatty part of meats before cooking. Steam vegetables in water  or broth. Meal planning  At meals, imagine dividing your plate into fourths: One-half of  your plate is fruits and vegetables. One-fourth of your plate is whole grains. One-fourth of your plate is protein, especially lean meats, poultry, eggs, tofu, beans, or nuts. Include low-fat  dairy as part of your daily diet. Lifestyle Choose healthy options in all settings, including home, work, school, restaurants, or stores. Prepare your food safely: Wash your hands after handling raw meats. Where you prepare food, keep surfaces clean by regularly washing with hot, soapy water . Keep raw meats separate from ready-to-eat foods, such as fruits and vegetables. Cook seafood, meat, poultry, and eggs to the recommended temperature. Get a food thermometer. Store foods at safe temperatures. In general: Keep cold foods at 84F (4.4C) or below. Keep hot foods at 184F (60C) or above. Keep your freezer at [facility] (-17.8C) or below. Foods are not safe to eat if they have been between the temperatures of 40-184F (4.4-60C) for more than 2 hours. What foods should I eat? Fruits Aim to eat 1-2 cups of fresh, canned (in natural juice), or frozen fruits each day. One cup of fruit equals 1 small apple, 1 large banana, 8 large strawberries, 1 cup (237 g) canned fruit,  cup (82 g) dried fruit, or 1 cup (240 mL) 100% juice. Vegetables Aim to eat 2-4 cups of fresh and frozen vegetables each day, including different varieties and colors. One cup of vegetables equals 1 cup (91 g) broccoli or cauliflower florets, 2 medium carrots, 2 cups (150 g) raw, leafy greens, 1 large tomato, 1 large bell pepper, 1 large sweet potato, or 1 medium white potato. Grains Aim to eat 5-10 ounce-equivalents of whole grains each day. Examples of 1 ounce-equivalent of grains include 1 slice of bread, 1 cup (40 g) ready-to-eat cereal, 3 cups (24 g) popcorn, or  cup (93 g) cooked rice. Meats and other proteins Try to eat 5-7 ounce-equivalents of protein each day. Examples of 1 ounce-equivalent of protein include 1 egg,  oz nuts (12 almonds, 24 pistachios, or 7 walnut halves), 1/4 cup (90 g) cooked beans, 6 tablespoons (90 g) hummus or 1 tablespoon (16 g) peanut butter. A cut of meat or fish that is the size of a deck of  cards is about 3-4 ounce-equivalents (85 g). Of the protein you eat each week, try to have at least 8 sounce (227 g) of seafood. This is about 2 servings per week. This includes salmon, trout, herring, sardines, and anchovies. Dairy Aim to eat 3 cup-equivalents of fat-free or low-fat dairy each day. Examples of 1 cup-equivalent of dairy include 1 cup (240 mL) milk, 8 ounces (250 g) yogurt, 1 ounces (44 g) natural cheese, or 1 cup (240 mL) fortified soy milk. Fats and oils Aim for about 5 teaspoons (21 g) of fats and oils per day. Choose monounsaturated fats, such as canola and olive oils, mayonnaise made with olive oil or avocado oil, avocados, peanut butter, and most nuts, or polyunsaturated fats, such as sunflower, corn, and soybean oils, walnuts, pine nuts, sesame seeds, sunflower seeds, and flaxseed. Beverages Aim for 6 eight-ounce glasses of water  per day. Limit coffee to 3-5 eight-ounce cups per day. Limit caffeinated beverages that have added calories, such as soda and energy drinks. If you drink alcohol: Limit how much you have to: 0-1 drink a day if you are male. 0-2 drinks a day if you are male. Know how much alcohol is in your drink. In the [place] one drink is one 12 oz bottle of beer (355 mL), one 5 oz glass of wine (  148 mL), or one 1 oz glass of hard liquor (44 mL). Seasoning and other foods Try not to add too much salt to your food. Try using herbs and spices instead of salt. Try not to add sugar to food. This information is based on [place] nutrition guidelines. To learn more, visit [employer]. Exact amounts may vary. You may need different amounts. This information is not intended to replace advice given to you by your health care provider. Make sure you discuss any questions you have with your health care provider. Document Revised: 02/07/2022 Document Reviewed: 02/07/2022 Elsevier Patient Education  2024 [employer].

## 2024-02-28 ENCOUNTER — Ambulatory Visit: Admitting: Nurse Practitioner

## 2024-03-04 IMAGING — DX DG ELBOW COMPLETE 3+V*R*
4 series · 4 of 4 positions shown · non-contrast
Comparison: None.

CLINICAL DATA: 35-year-old male with right elbow pain exacerbated
by movement. No known injury.

EXAM:
RIGHT ELBOW - COMPLETE 3+ VIEW

[elbow ap]
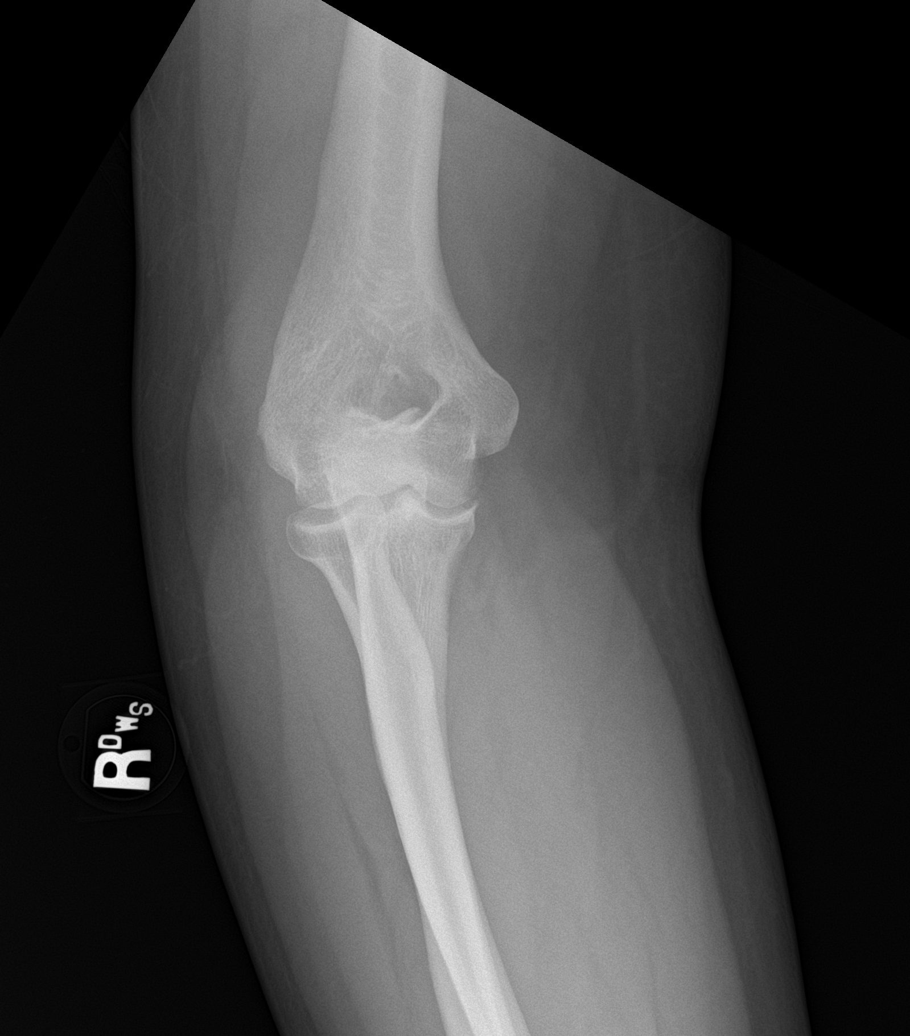

[elbow obl (1 of 2)]
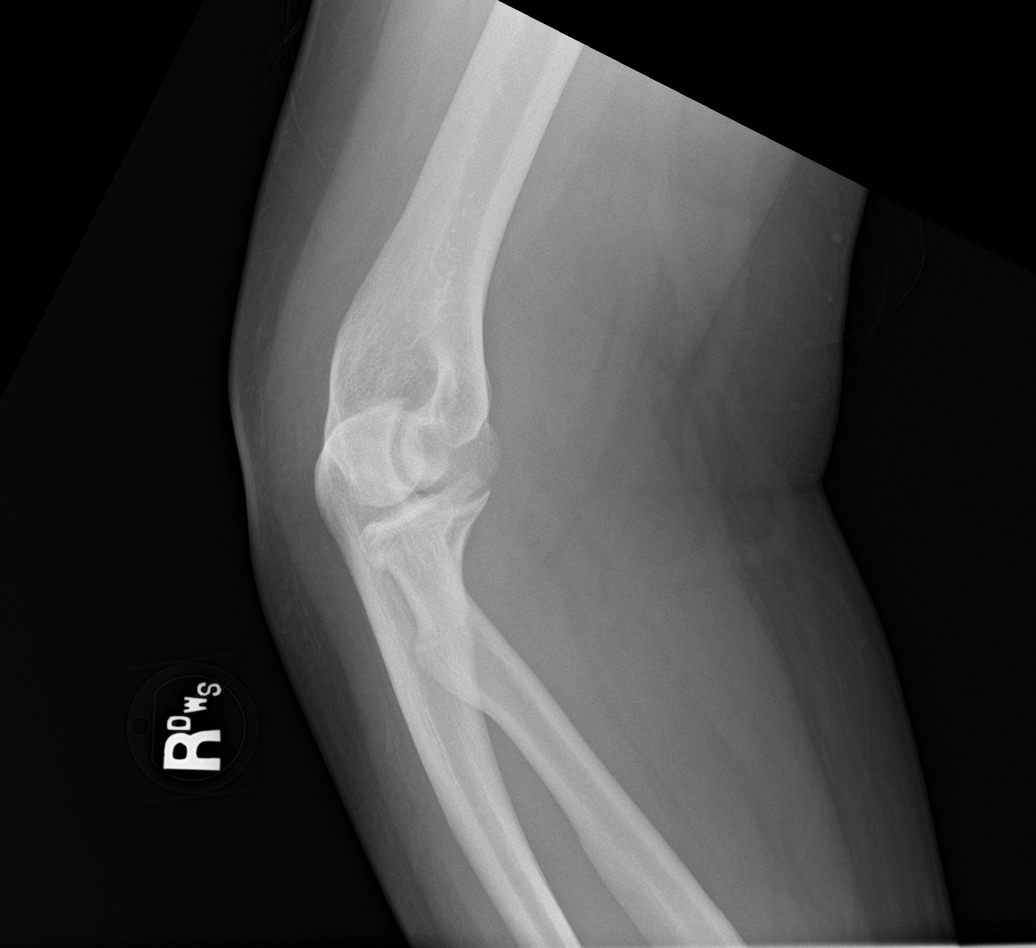

[elbow obl (2 of 2)]
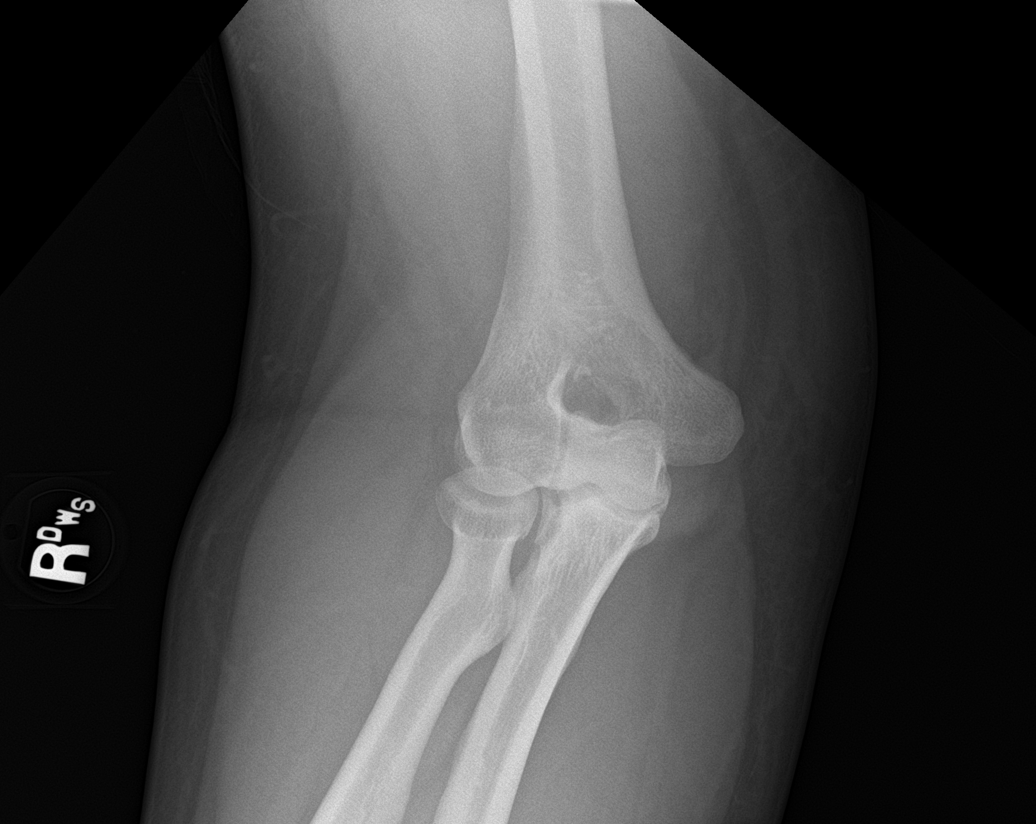

[elbow lat]
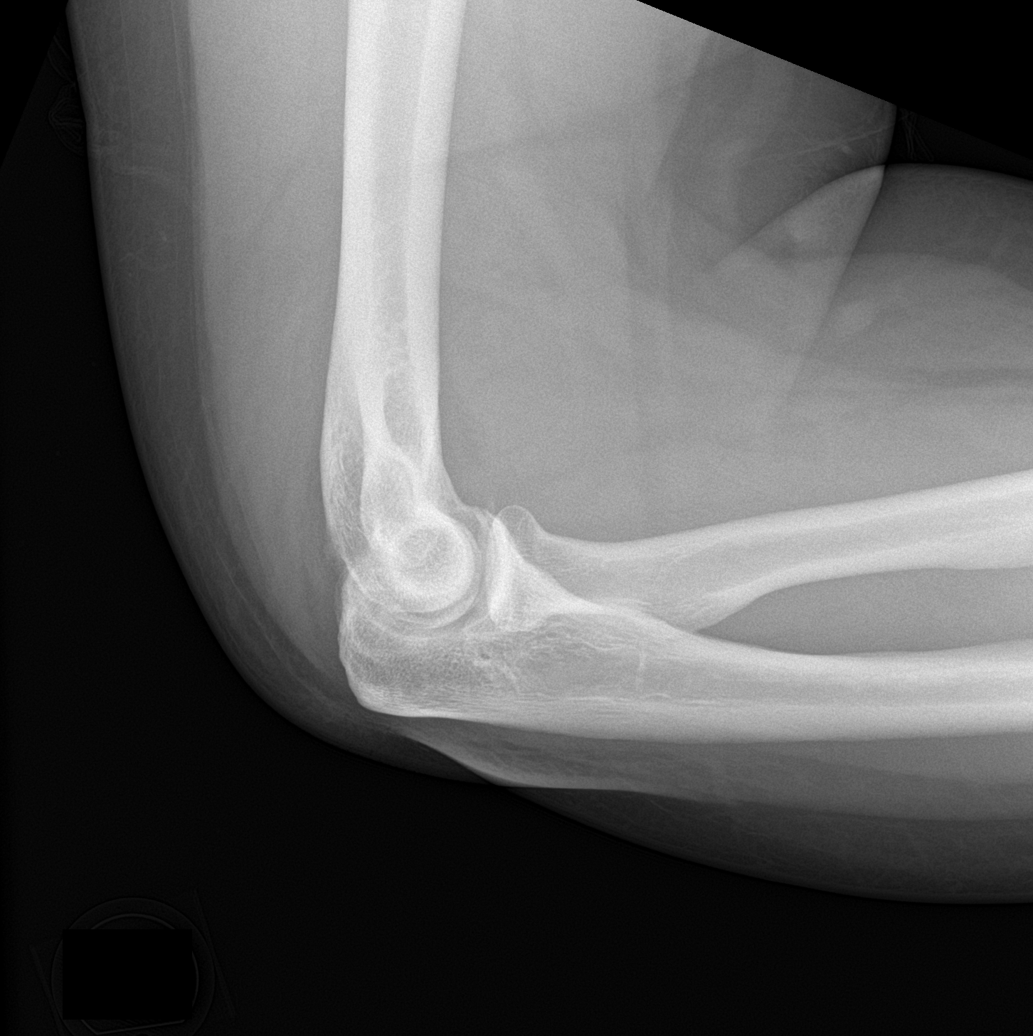

[4 of 4 positions shown; findings below may reference images not displayed]

FINDINGS: Bone mineralization is within normal limits. Questionable joint
effusion. Other soft tissue contours appear within normal limits.
Preserved joint spaces and alignment. Radial head appears intact. No
acute osseous abnormality identified.
IMPRESSION: Difficult to exclude a small joint effusion. But no osseous
abnormality identified.

## 2024-03-23 NOTE — Patient Instructions (Incomplete)
 He can call [phone] option 3   Abdominal Pain, Adult  Many things can cause belly (abdominal) pain. In most cases, belly pain is not a serious problem and can be watched and treated at home. But in some cases, it can be serious. Your doctor will try to find the cause of your belly pain. Follow these instructions at home: Medicines Take over-the-counter and prescription medicines only as told by your doctor. Do not take medicines that help you poop (laxatives) unless told by your doctor. General instructions Watch your belly pain for any changes. Tell your doctor if the pain gets worse. Drink enough fluid to keep your pee (urine) pale yellow. Contact a doctor if: Your belly pain changes or gets worse. You have very bad cramping or bloating in your belly. You vomit. Your pain gets worse with meals, after eating, or with certain foods. You have trouble pooping or have watery poop for more than 2-3 days. You are not hungry, or you lose weight without trying. You have signs of not getting enough fluid or water (dehydration). These may include: Dark pee, very little pee, or no pee. Cracked lips or dry mouth. Feeling sleepy or weak. You have pain when you pee or poop. Your belly pain wakes you up at night. You have blood in your pee. You have a fever. Get help right away if: You cannot stop vomiting. Your pain is only in one part of your belly, like on the right side. You have bloody or black poop, or poop that looks like tar. You have trouble breathing. You have chest pain. These symptoms may be an emergency. Get help right away. Call 911. Do not wait to see if the symptoms will go away. Do not drive yourself to the hospital. This information is not intended to replace advice given to you by your health care provider. Make sure you discuss any questions you have with your health care provider. Document Revised: 02/23/2022 Document Reviewed: 02/23/2022 Elsevier Patient Education   2024 [employer].

## 2024-03-27 ENCOUNTER — Encounter: Payer: Self-pay | Admitting: Nurse Practitioner

## 2024-03-27 ENCOUNTER — Ambulatory Visit: Admitting: Nurse Practitioner

## 2024-03-27 VITALS — BP 132/84 | HR 95 | Temp 98.2°F | Ht 73.0 in | Wt >= 6400 oz

## 2024-03-27 DIAGNOSIS — K439 Ventral hernia without obstruction or gangrene: Secondary | ICD-10-CM

## 2024-03-27 DIAGNOSIS — R350 Frequency of micturition: Secondary | ICD-10-CM | POA: Diagnosis not present

## 2024-03-27 DIAGNOSIS — R1084 Generalized abdominal pain: Secondary | ICD-10-CM

## 2024-03-27 DIAGNOSIS — R7303 Prediabetes: Secondary | ICD-10-CM

## 2024-03-27 LAB — BAYER DCA HB A1C WAIVED: HB A1C (BAYER DCA - WAIVED): 6.1 % — ABNORMAL HIGH (ref 4.8–5.6)

## 2024-03-27 LAB — URINALYSIS, ROUTINE W REFLEX MICROSCOPIC
Bilirubin, UA: NEGATIVE
Ketones, UA: NEGATIVE
Leukocytes,UA: NEGATIVE
Nitrite, UA: NEGATIVE
Protein,UA: NEGATIVE
Specific Gravity, UA: 1.02 (ref 1.005–1.030)
Urobilinogen, Ur: 0.2 mg/dL (ref 0.2–1.0)
pH, UA: 6.5 (ref 5.0–7.5)

## 2024-03-27 LAB — MICROSCOPIC EXAMINATION: Bacteria, UA: NONE SEEN

## 2024-03-27 MED ORDER — METFORMIN HCL 500 MG PO TABS: 500.0000 mg | ORAL_TABLET | Freq: Two times a day (BID) | ORAL | 3 refills | Status: AC

## 2024-03-27 NOTE — Assessment & Plan Note (Signed)
 A1c today has come down a little from 6.2% to 6.1%, but is having diabetic symptoms and 2+ glucose in urine.  Family history of diabetes.  Will start Metformin 500 MG BID and see if tolerates, this may assist with current symptoms.

## 2024-03-27 NOTE — Assessment & Plan Note (Signed)
 Over past >3 months with change in bowel pattern.  ?hernia, will obtain imaging to further assess. Recent labs reassuring.  Recommend continue Miralax every morning to see if benefit bowel pattern and more formed stool.  Determine all next steps imaging returns. He will call to schedule this.

## 2024-03-27 NOTE — Progress Notes (Signed)
 BP 132/84 (BP Location: Left Arm, Patient Position: Sitting, Cuff Size: Large)   Pulse 95   Temp 98.2 F (36.8 C) (Oral)   Ht 6' 1 (1.854 m)   Wt (!) 415 lb (188.2 kg)   SpO2 98%   BMI 54.75 kg/m    Subjective:    Patient ID: Jonathan Sherman, male    DOB: 10/04/1985, 38 y.o.   MRN: [id number]  HPI: AROLDO GALLI is a 38 y.o. male  Chief Complaint  Patient presents with   Follow-up   Mass    On the stomach area, was not called regarding getting imgaing   Urinary Frequency    Noticed have to go about 3 times then he used for about a month now   BP at home has been <130/80 at range.    CONSTIPATION Follow-up today for abdominal discomfort. Started Miralax last visit but does not seem to be helping.  Continues to have some constipation once every other day.  Very irregular and not very solid. After gall bladder removed in 2018 his BM became more frequent. Still continue to feel slight pressure and pain when eating. Was ordered imaging last visit to check on abdomen, but has not heard from anyone to schedule. Does strain a little bit. No family history of bowel issues or colon cancer.   Duration: 4-5 months Alleviating factors: nothing Aggravating factors: nothing Status: about the same from last visit Treatments attempted: Dulcolax, Miralax Fever: no Nausea: no Vomiting: no Weight loss: no Decreased appetite: no Diarrhea: yes Constipation: yes Blood in stool: no Heartburn: all the time, red sauce makes it worse Jaundice: no Rash: no  URINARY SYMPTOMS About one month ago started to have more urinary frequency.  Sometimes so bad can not get to bathroom in time. September A1c was 6.2%. Feeling more hungry than normal and more thirsty.  Has been gaining a lot of weight without meaning too. Dysuria: no Urinary frequency: yes Urgency: yes Small volume voids: no Symptom severity: no Urinary incontinence: occasional dribbling Foul odor: no Hematuria: no Abdominal pain: at  baseline as above Back pain: no Suprapubic pain/pressure: no Flank pain: no Fever:  no Vomiting: no Status: stable Previous urinary tract infection: no Recurrent urinary tract infection: no Treatments attempted: cut out sodas and worked on diet changes    Relevant past medical, surgical, family and social history reviewed and updated as indicated. Interim medical history since our last visit reviewed. Allergies and medications reviewed and updated.  Review of Systems  Constitutional:  Negative for activity change, diaphoresis, fatigue and fever.  Respiratory:  Negative for cough, chest tightness, shortness of breath and wheezing.   Cardiovascular:  Negative for chest pain, palpitations and leg swelling.  Gastrointestinal:  Positive for abdominal pain, constipation and diarrhea. Negative for abdominal distention, nausea and vomiting.  Endocrine: Positive for polydipsia, polyphagia and polyuria. Negative for cold intolerance and heat intolerance.  Neurological: Negative.   Psychiatric/Behavioral: Negative.      Per HPI unless specifically indicated above     Objective:    BP 132/84 (BP Location: Left Arm, Patient Position: Sitting, Cuff Size: Large)   Pulse 95   Temp 98.2 F (36.8 C) (Oral)   Ht 6' 1 (1.854 m)   Wt (!) 415 lb (188.2 kg)   SpO2 98%   BMI 54.75 kg/m   Wt Readings from Last 3 Encounters:  03/27/24 (!) 415 lb (188.2 kg)  01/24/24 (!) 412 lb (186.9 kg)  08/24/23 (!) 406  lb (184.2 kg)    Physical Exam Vitals and nursing note reviewed.  Constitutional:      General: He is awake. He is not in acute distress.    Appearance: Normal appearance. He is well-developed and well-groomed. He is obese. He is not ill-appearing or toxic-appearing.  HENT:     Head: Normocephalic.     Right Ear: Hearing and external ear normal.     Left Ear: Hearing and external ear normal.  Eyes:     General: Lids are normal.     Extraocular Movements: Extraocular movements intact.      Conjunctiva/sclera: Conjunctivae normal.  Neck:     Thyroid : No thyromegaly.     Vascular: No carotid bruit.  Cardiovascular:     Rate and Rhythm: Normal rate and regular rhythm.     Heart sounds: Normal heart sounds. No murmur heard.    No gallop.  Pulmonary:     Effort: No accessory muscle usage or respiratory distress.     Breath sounds: Normal breath sounds.  Abdominal:     General: Bowel sounds are normal. There is no distension.     Palpations: Abdomen is soft.     Tenderness: There is no abdominal tenderness. There is no right CVA tenderness or left CVA tenderness.  Musculoskeletal:     Cervical back: Full passive range of motion without pain.     Right lower leg: No edema.     Left lower leg: No edema.  Lymphadenopathy:     Cervical: No cervical adenopathy.  Skin:    General: Skin is warm.     Capillary Refill: Capillary refill takes less than 2 seconds.  Neurological:     Mental Status: He is alert and oriented to person, place, and time.     Deep Tendon Reflexes: Reflexes are normal and symmetric.     Reflex Scores:      Brachioradialis reflexes are 2+ on the right side and 2+ on the left side.      Patellar reflexes are 2+ on the right side and 2+ on the left side. Psychiatric:        Attention and Perception: Attention normal.        Mood and Affect: Mood normal.        Speech: Speech normal.        Behavior: Behavior normal. Behavior is cooperative.        Thought Content: Thought content normal.     Results for orders placed or performed in visit on 01/31/24  TSH   Collection Time: 01/31/24 10:38 AM  Result Value Ref Range   TSH 4.060 0.450 - 4.500 uIU/mL  Lipid Panel w/o Chol/HDL Ratio   Collection Time: 01/31/24 10:38 AM  Result Value Ref Range   Cholesterol, Total 197 100 - 199 mg/dL   Triglycerides 807 (H) 0 - 149 mg/dL   HDL 40 >60 mg/dL   VLDL Cholesterol Cal 34 5 - 40 mg/dL   LDL Chol Calc (NIH) 876 (H) 0 - 99 mg/dL  Basic metabolic panel  with GFR   Collection Time: 01/31/24 10:38 AM  Result Value Ref Range   Glucose 161 (H) 70 - 99 mg/dL   BUN 13 6 - 20 mg/dL   Creatinine, Ser 9.19 0.76 - 1.27 mg/dL   eGFR 881 >40 fO/fpw/8.26   BUN/Creatinine Ratio 16 9 - 20   Sodium 138 134 - 144 mmol/L   Potassium 4.0 3.5 - 5.2 mmol/L   Chloride 100 96 -  106 mmol/L   CO2 21 20 - 29 mmol/L   Calcium 9.1 8.7 - 10.2 mg/dL  CBC with Differential/Platelet   Collection Time: 01/31/24 10:38 AM  Result Value Ref Range   WBC 8.1 3.4 - 10.8 x10E3/uL   RBC 5.13 4.14 - 5.80 x10E6/uL   Hemoglobin 14.9 13.0 - 17.7 g/dL   Hematocrit 54.8 62.4 - 51.0 %   MCV 88 79 - 97 fL   MCH 29.0 26.6 - 33.0 pg   MCHC 33.0 31.5 - 35.7 g/dL   RDW 86.7 88.3 - 84.5 %   Platelets 223 150 - 450 x10E3/uL   Neutrophils 70 Not Estab. %   Lymphs 23 Not Estab. %   Monocytes 5 Not Estab. %   Eos 1 Not Estab. %   Basos 0 Not Estab. %   Neutrophils Absolute 5.7 1.4 - 7.0 x10E3/uL   Lymphocytes Absolute 1.9 0.7 - 3.1 x10E3/uL   Monocytes Absolute 0.4 0.1 - 0.9 x10E3/uL   EOS (ABSOLUTE) 0.1 0.0 - 0.4 x10E3/uL   Basophils Absolute 0.0 0.0 - 0.2 x10E3/uL   Immature Granulocytes 0 Not Estab. %   Immature Grans (Abs) 0.0 0.0 - 0.1 x10E3/uL  Testosterone , Free, Total, SHBG   Collection Time: 01/31/24 10:38 AM  Result Value Ref Range   Testosterone  CANCELED ng/dL   Testosterone , Free CANCELED pg/mL   Sex Hormone Binding CANCELED nmol/L  Specimen status report   Collection Time: 01/31/24 10:38 AM  Result Value Ref Range   specimen status report Comment       Assessment & Plan:   Problem List Items Addressed This Visit       Other   Prediabetes   A1c today has come down a little from 6.2% to 6.1%, but is having diabetic symptoms and 2+ glucose in urine.  Family history of diabetes.  Will start Metformin 500 MG BID and see if tolerates, this may assist with current symptoms.      Relevant Orders   Bayer DCA Hb A1c Waived   Generalized abdominal pain   Over  past >3 months with change in bowel pattern.  ?hernia, will obtain imaging to further assess. Recent labs reassuring.  Recommend continue Miralax every morning to see if benefit bowel pattern and more formed stool.  Determine all next steps imaging returns. He will call to schedule this.      Other Visit Diagnoses       Urinary frequency    -  Primary   Refer to prediabetes plan of care, UA overall reassuring with exception of 2+ glucose and trace blood.   Relevant Orders   Urinalysis, Routine w reflex microscopic        Follow up plan: Return in about 4 weeks (around 04/24/2024) for Prediabetes, started Metformin on 03/27/24.

## 2024-04-03 ENCOUNTER — Ambulatory Visit
Admission: RE | Admit: 2024-04-03 | Discharge: 2024-04-03 | Disposition: A | Discharge status: Home / Self Care | Source: Ambulatory Visit | Attending: Nurse Practitioner | Admitting: Nurse Practitioner

## 2024-04-03 DIAGNOSIS — R1084 Generalized abdominal pain: Secondary | ICD-10-CM | POA: Diagnosis present

## 2024-04-04 NOTE — Progress Notes (Signed)
 Contacted via MyChart  Good morning Jonathan Sherman, your imaging has returned. There is a small to moderate sized hernia noted, as suspected. On review of report this has increased in size from your previous imaging. There is also note of a tiny kidney stone to the left side, this is something we can monitor as it may never cause symptoms + mild hepatic steatosis (or fatty liver) for this the focus is on healthy diet changes and regular exercise.  For the hernia we could send you to general surgery if it is causing you any discomfort? Let me know direction you may want to take. Any questions? Keep being stellar!!  Thank you for allowing me to participate in your care.  I appreciate you. Kindest regards, Nelta Caudill

## 2024-04-05 DIAGNOSIS — K439 Ventral hernia without obstruction or gangrene: Secondary | ICD-10-CM | POA: Insufficient documentation

## 2024-04-05 NOTE — Addendum Note (Signed)
 Addended by: Chane Magner T on: 04/05/2024 09:25 AM   Modules accepted: Orders

## 2024-04-07 ENCOUNTER — Other Ambulatory Visit: Payer: Self-pay | Admitting: Podiatry

## 2024-04-08 ENCOUNTER — Ambulatory Visit: Admitting: Surgery

## 2024-04-08 ENCOUNTER — Encounter: Payer: Self-pay | Admitting: Surgery

## 2024-04-08 VITALS — BP 152/93 | HR 94 | Temp 98.8°F | Ht 72.0 in | Wt >= 6400 oz

## 2024-04-08 DIAGNOSIS — K436 Other and unspecified ventral hernia with obstruction, without gangrene: Secondary | ICD-10-CM

## 2024-04-08 NOTE — Progress Notes (Signed)
 04/08/2024  Reason for Visit:  Ventral hernia  Requesting Provider:  Melanie Polio, NP  History of Present Illness: Jonathan Sherman is a 38 y.o. male presenting for evaluation of a ventral hernia.  The patient reports that he has noticed this getting larger over the last 6 months.  However denies any specific pain or issues with it.  He reports sometimes depending on how his position he may have a sharp sensation in the area but this goes away relatively quickly.  This also does not happen often.  He has a prior laparoscopic cholecystectomy in 2018 and the hernia is in the vicinity of one of his incisions.  He denies any specific times where he feels the contents being larger or smaller.  He does not interfere with his day-to-day activity.  He had a CT scan of his abdomen and pelvis on 04/03/2024 which showed a 2 cm hernia defect containing fat without any inflammatory changes.  He also has some mild diastases recti.  Denies any nausea, vomiting, diarrhea.  Reports some constipation.  Denies any fevers or chills, chest pain, shortness of breath.  Past Medical History: Past Medical History:  Diagnosis Date   Hyperlipidemia    Hypertension    Prediabetes      Past Surgical History: Past Surgical History:  Procedure Laterality Date   KNEE ARTHROSCOPY WITH ANTERIOR CRUCIATE LIGAMENT (ACL) REPAIR Left    LAPAROSCOPIC CHOLECYSTECTOMY     TONSILLECTOMY      Home Medications: Prior to Admission medications   Medication Sig Start Date End Date Taking? Authorizing Provider  FOLIC ACID PO Take 300 mg by mouth daily. Take 2 tablets by mouth daily   Yes [provider]  gabapentin (NEURONTIN) 300 MG capsule Take 300 mg by mouth. 08/22/23 08/21/24 Yes [provider]  magnesium oxide (MAG-OX) 400 (240 Mg) MG tablet Take 400 mg by mouth daily.   Yes [provider]  meloxicam  (MOBIC ) 15 MG tablet TAKE 1 TABLET (15 MG TOTAL) BY MOUTH DAILY. 04/08/24  Yes Tobie Franky SQUIBB, DPM   meloxicam  (MOBIC ) 7.5 MG tablet Take 7.5 mg by mouth daily.   Yes [provider]  metFORMIN (GLUCOPHAGE) 500 MG tablet Take 1 tablet (500 mg total) by mouth 2 (two) times daily with a meal. 03/27/24  Yes Cannady, Jolene T, NP  olmesartan  (BENICAR ) 20 MG tablet Take 0.5 tablets (10 mg total) by mouth daily. 01/24/24  Yes Cannady, Jolene T, NP  tamsulosin  (FLOMAX ) 0.4 MG CAPS capsule Take 0.4 mg by mouth daily. 06/25/23  Yes [provider]  Vitamin D, Ergocalciferol, (DRISDOL) 1.25 MG (50000 UNIT) CAPS capsule Take 50,000 Units by mouth once a week. 07/28/23  Yes [provider]    Allergies: No Known Allergies  Social History:  reports that he has never smoked. He has never used smokeless tobacco. He reports that he does not drink alcohol and does not use drugs.   Family History: Family History  Problem Relation Age of Onset   Melanoma Mother    Heart disease Father    Hypertension Father    Heart attack Father    Hypertension Sister    Diabetes Brother    Diabetes Brother    COPD Maternal Grandmother    Heart disease Maternal Grandfather    Hypertension Maternal Grandfather    Lymphoma Maternal Grandfather     Review of Systems: Review of Systems  Constitutional:  Negative for chills and fever.  Respiratory:  Negative for shortness of  breath.   Cardiovascular:  Negative for chest pain.  Gastrointestinal:  Positive for constipation. Negative for abdominal pain, nausea and vomiting.  Genitourinary:  Negative for dysuria.    Physical Exam BP (!) 152/93   Pulse 94   Temp 98.8 F (37.1 C) (Oral)   Ht 6' (1.829 m)   Wt (!) 409 lb (185.5 kg)   SpO2 95%   BMI 55.47 kg/m  CONSTITUTIONAL: No acute distress HEENT:  Normocephalic, atraumatic, extraocular motion intact. RESPIRATORY:  Lungs are clear, and breath sounds are equal bilaterally. Normal respiratory effort without pathologic use of accessory muscles. CARDIOVASCULAR: Heart is regular without  murmurs, gallops, or rubs. GI: The abdomen is soft, obese, non-distended, non-tender to palpation.  The patient had an incarcerated ventral hernia that is just superior and to the right of the umbilicus.  The hernia contents feel soft and there's no tenderness in trying to manipulate the contents.  MUSCULOSKELETAL:  Normal muscle strength and tone in all four extremities.  No peripheral edema or cyanosis. NEUROLOGIC:  Motor and sensation is grossly normal.  Cranial nerves are grossly intact. PSYCH:  Alert and oriented to person, place and time. Affect is normal.  Laboratory Analysis: Labs from 01/31/2024: Sodium 138, potassium 4, chloride 100, CO2 21, BUN 13, creatinine 0.8.  WBC 8.1, hemoglobin 14.9, hematocrit 45.1, platelets 223.  Hemoglobin A1c 6.1.  Imaging: CT abdomen/pelvis on 04/03/24 IMPRESSION: Small to moderate size fat-containing ventral hernia, increased in size since previous study.   Tiny left renal calculi. No evidence of ureteral calculi or hydronephrosis.   Mild hepatic steatosis.  Assessment and Plan: This is a 38 y.o. male with an incarcerated ventral hernia.  -- Discussed with the patient the findings on exam today as well as his CT scan.  He does have a small defect measuring 2 cm to the right and superior of the umbilicus.  Although the defect is small, the amount of preperitoneal fat that bulges through is larger.  Discussed with him how this has a mushroom type effect where the opening is systolic that is narrow and the head is the contents bulging which are wider.  On exam, I am unable to reduce the hernia making this an incarcerated hernia.  However that is overall asymptomatic or very minimally symptomatic at best. - Discussed with patient that given his BMI, it would be more ideal to wait for him to be able to lose weight in order to be able to do a better repair of his hernia.  Discussed with him that given the weight, he would be at higher risk of hernia  recurrences and unfortunately each hernia repair becomes more complex compared to the previous repair.  As such, would be ideal to do 1 repair and be done.  The patient is in agreement with this. - Encouraged the patient for now to increase his physical activity and have dietary changes to improve his weight loss.  I have discussed with Ms. Cannady as well about the potential for prescription for weight loss medication.  The patient has an appointment with her in about 2 weeks at which time this will be discussed.. - Discussed with patient that we will do periodic follow-ups to see how everything is progressing and also to do more exams to make sure there is no worsening issues with the hernia itself.  Discussed with patient that if he starts having worsening pain or progressive symptoms with this hernia, then we will proceed sooner with surgical repair knowing the  increased risk for recurrence.  For now, he will follow-up with me in 3 months at which time we will reevaluate his progress in the hernia. - All of his questions have been answered.  I spent 30 minutes dedicated to the care of this patient on the date of this encounter to include pre-visit review of records, face-to-face time with the patient discussing diagnosis and management, and any post-visit coordination of care.   Aloysius Sheree Plant, MD [facility]

## 2024-04-08 NOTE — Patient Instructions (Addendum)
 Healthy Eating, Adult Healthy eating may help you get and keep a healthy body weight, reduce the risk of chronic disease, and live a long and productive life. It is important to follow a healthy eating pattern. Your nutritional and calorie needs should be met mainly by different nutrient-rich foods. What are tips for following this plan? Reading food labels Read labels and choose the following: Reduced or low sodium products. Juices with 100% fruit juice. Foods with low saturated fats (<3 g per serving) and high polyunsaturated and monounsaturated fats. Foods with whole grains, such as whole wheat, cracked wheat, brown rice, and wild rice. Whole grains that are fortified with folic acid. This is recommended for females who are pregnant or who want to become pregnant. Read labels and do not eat or drink the following: Foods or drinks with added sugars. These include foods that contain brown sugar, corn sweetener, corn syrup, dextrose, fructose, glucose, high-fructose corn syrup, honey, invert sugar, lactose, malt syrup, maltose, molasses, raw sugar, sucrose, trehalose, or turbinado sugar. Limit your intake of added sugars to less than 10% of your total daily calories. Do not eat more than the following amounts of added sugar per day: 6 teaspoons (25 g) for females. 9 teaspoons (38 g) for males. Foods that contain processed or refined starches and grains. Refined grain products, such as white flour, degermed cornmeal, white bread, and white rice. Shopping Choose nutrient-rich snacks, such as vegetables, whole fruits, and nuts. Avoid high-calorie and high-sugar snacks, such as potato chips, fruit snacks, and candy. Use oil-based dressings and spreads on foods instead of solid fats such as butter, margarine, sour cream, or cream cheese. Limit pre-made sauces, mixes, and instant products such as flavored rice, instant noodles, and ready-made pasta. Try more plant-protein sources, such as tofu,  tempeh, black beans, edamame, lentils, nuts, and seeds. Explore eating plans such as the Mediterranean diet or vegetarian diet. Try heart-healthy dips made with beans and healthy fats like hummus and guacamole. Vegetables go great with these. Cooking Use oil to saut or stir-fry foods instead of solid fats such as butter, margarine, or lard. Try baking, boiling, grilling, or broiling instead of frying. Remove the fatty part of meats before cooking. Steam vegetables in water or broth. Meal planning  At meals, imagine dividing your plate into fourths: One-half of your plate is fruits and vegetables. One-fourth of your plate is whole grains. One-fourth of your plate is protein, especially lean meats, poultry, eggs, tofu, beans, or nuts. Include low-fat dairy as part of your daily diet. Lifestyle Choose healthy options in all settings, including home, work, school, restaurants, or stores. Prepare your food safely: Wash your hands after handling raw meats. Where you prepare food, keep surfaces clean by regularly washing with hot, soapy water. Keep raw meats separate from ready-to-eat foods, such as fruits and vegetables. Cook seafood, meat, poultry, and eggs to the recommended temperature. Get a food thermometer. Store foods at safe temperatures. In general: Keep cold foods at 61F (4.4C) or below. Keep hot foods at 161F (60C) or above. Keep your freezer at [facility] (-17.8C) or below. Foods are not safe to eat if they have been between the temperatures of 40-161F (4.4-60C) for more than 2 hours. What foods should I eat? Fruits Aim to eat 1-2 cups of fresh, canned (in natural juice), or frozen fruits each day. One cup of fruit equals 1 small apple, 1 large banana, 8 large strawberries, 1 cup (237 g) canned fruit,  cup (82 g) dried fruit,  or 1 cup (240 mL) 100% juice. Vegetables Aim to eat 2-4 cups of fresh and frozen vegetables each day, including different varieties and colors. One cup  of vegetables equals 1 cup (91 g) broccoli or cauliflower florets, 2 medium carrots, 2 cups (150 g) raw, leafy greens, 1 large tomato, 1 large bell pepper, 1 large sweet potato, or 1 medium white potato. Grains Aim to eat 5-10 ounce-equivalents of whole grains each day. Examples of 1 ounce-equivalent of grains include 1 slice of bread, 1 cup (40 g) ready-to-eat cereal, 3 cups (24 g) popcorn, or  cup (93 g) cooked rice. Meats and other proteins Try to eat 5-7 ounce-equivalents of protein each day. Examples of 1 ounce-equivalent of protein include 1 egg,  oz nuts (12 almonds, 24 pistachios, or 7 walnut halves), 1/4 cup (90 g) cooked beans, 6 tablespoons (90 g) hummus or 1 tablespoon (16 g) peanut butter. A cut of meat or fish that is the size of a deck of cards is about 3-4 ounce-equivalents (85 g). Of the protein you eat each week, try to have at least 8 sounce (227 g) of seafood. This is about 2 servings per week. This includes salmon, trout, herring, sardines, and anchovies. Dairy Aim to eat 3 cup-equivalents of fat-free or low-fat dairy each day. Examples of 1 cup-equivalent of dairy include 1 cup (240 mL) milk, 8 ounces (250 g) yogurt, 1 ounces (44 g) natural cheese, or 1 cup (240 mL) fortified soy milk. Fats and oils Aim for about 5 teaspoons (21 g) of fats and oils per day. Choose monounsaturated fats, such as canola and olive oils, mayonnaise made with olive oil or avocado oil, avocados, peanut butter, and most nuts, or polyunsaturated fats, such as sunflower, corn, and soybean oils, walnuts, pine nuts, sesame seeds, sunflower seeds, and flaxseed. Beverages Aim for 6 eight-ounce glasses of water per day. Limit coffee to 3-5 eight-ounce cups per day. Limit caffeinated beverages that have added calories, such as soda and energy drinks. If you drink alcohol: Limit how much you have to: 0-1 drink a day if you are male. 0-2 drinks a day if you are male. Know how much alcohol is in your drink.  In the [place] one drink is one 12 oz bottle of beer (355 mL), one 5 oz glass of wine (148 mL), or one 1 oz glass of hard liquor (44 mL). Seasoning and other foods Try not to add too much salt to your food. Try using herbs and spices instead of salt. Try not to add sugar to food. This information is based on [place] nutrition guidelines. To learn more, visit disposablenylon.be. Exact amounts may vary. You may need different amounts. This information is not intended to replace advice given to you by your health care provider. Make sure you discuss any questions you have with your health care provider. Document Revised: 02/07/2022 Document Reviewed: 02/07/2022 Elsevier Patient Education  2024 [employer] Inc.     Belly Hernia (Ventral Hernia): What to Know  A ventral hernia is a bulge of tissue from inside the belly that pushes through a weak area of the belly. Sometimes, the bulge may have tissue from the small intestine or the large intestine. Ventral hernias do not go away without surgery. There are several types of ventral hernias. You may have: A hernia at a place where surgery was done (incisional hernia). A hernia just above the belly button (epigastric or paraumbilical hernia) or at the belly button (umbilical hernia). These can happen  because of heavy lifting or straining. A hernia that comes and goes (reducible hernia). It may be visible only when you lift or strain. This type of hernia can be pushed back into the belly. A hernia that traps belly tissue inside the hernia (incarcerated hernia). This hernia cannot be pushed back into the belly. A hernia that cuts off blood flow to the tissues inside the hernia (strangulation hernia). The tissues can start to die if this happens. This is a very painful bulge that cannot be pushed back into the belly. This type of hernia is a medical emergency. What are the causes? This condition happens when tissue in the belly pushes on a weak area in the  muscles. What increases the risk? You're more likely to have this condition if: You are 60 years or older. You had belly surgery in the past. This is common if there was an infection after surgery. You had an injury to the belly. You often lift or push heavy objects. You have been pregnant several times. You have long-term (chronic) health conditions that put pressure in your belly. These include: Being overweight or obese. Having a buildup of fluid inside your belly (ascites). You throw up or cough over and over again. You have trouble pooping (constipation). You strain to poop or pee. What are the signs or symptoms? The only symptom of a ventral hernia may be a painless bulge in the belly.  Reducible hernia may be visible only when you strain, cough, or lift. Other symptoms may include: Dull pain. A feeling of pressure. Symptoms of an incarcerated hernia may include: Tenderness at hernia site. Bloating. Throwing up or feeling like you may throw up. Trouble pooping or no pooping at all. Symptoms of a strangulated hernia may include: More pain. Throwing up or feeling like you may throw up. Pain when pressing on the hernia. The skin over the hernia turning red or purple. Trouble pooping. Blood in the poop. How is this diagnosed? This condition may be diagnosed based on: Your symptoms and medical history. A physical exam. You may be asked to cough or strain while standing. These actions increase the pressure inside your belly and force the hernia through the opening in your belly. Your health care provider may try to reduce the hernia by gently pushing the hernia back in. Imaging studies, such as an ultrasound or CT scan. How is this treated? This condition is treated with surgery. If you have a strangulated hernia, surgery is done as soon as possible. If your hernia is small and not incarcerated, you may be asked to lose some weight before surgery. Follow these instructions at  home: Eat and drink only as you've been told. Lose weight, if told by your provider. You may have to avoid lifting. Ask your provider how much you can safely lift. Avoid activities that increase pressure on your hernia. Take your medicines only as told. You may need to take steps to help treat or prevent trouble pooping (constipation), such as: Taking medicines to help you poop. Eating foods high in fiber, like beans, whole grains, and fresh fruits and vegetables. Drinking more fluids as told. Ask your provider if it's safe to drive or use machines while taking your medicine. Contact a health care provider if: Your hernia gets larger or feels hard. Your hernia becomes painful. Get help right away if: Your hernia becomes very painful. You have pain along with any of these: Changes in skin color in the area of the hernia.  Feeling like throwing up. Throwing up. Fever. These symptoms may be an emergency. Call 911 right away. Do not wait to see if the symptoms will go away. Do not drive yourself to the hospital. This information is not intended to replace advice given to you by your health care provider. Make sure you discuss any questions you have with your health care provider. Document Revised: 11/15/2022 Document Reviewed: 11/15/2022 Elsevier Patient Education  2024 [employer].

## 2024-04-23 ENCOUNTER — Telehealth: Admitting: Family Medicine

## 2024-04-23 DIAGNOSIS — B9689 Other specified bacterial agents as the cause of diseases classified elsewhere: Secondary | ICD-10-CM

## 2024-04-23 DIAGNOSIS — J208 Acute bronchitis due to other specified organisms: Secondary | ICD-10-CM | POA: Diagnosis not present

## 2024-04-23 MED ORDER — ALBUTEROL SULFATE HFA 108 (90 BASE) MCG/ACT IN AERS: 1.0000 | INHALATION_SPRAY | RESPIRATORY_TRACT | 0 refills | Status: AC | PRN

## 2024-04-23 MED ORDER — PROMETHAZINE-DM 6.25-15 MG/5ML PO SYRP: 5.0000 mL | ORAL_SOLUTION | Freq: Four times a day (QID) | ORAL | 0 refills | Status: DC | PRN

## 2024-04-23 MED ORDER — SALINE NASAL SPRAY 0.65 % NA SOLN: 1.0000 | NASAL | 0 refills | Status: DC | PRN

## 2024-04-23 MED ORDER — PREDNISONE 10 MG (21) PO TBPK: ORAL_TABLET | ORAL | 0 refills | Status: DC

## 2024-04-23 MED ORDER — AZITHROMYCIN 250 MG PO TABS: ORAL_TABLET | ORAL | 0 refills | Status: AC

## 2024-04-23 MED ORDER — BENZONATATE 200 MG PO CAPS: 200.0000 mg | ORAL_CAPSULE | Freq: Three times a day (TID) | ORAL | 0 refills | Status: DC | PRN

## 2024-04-23 NOTE — Patient Instructions (Addendum)
 Jonathan Sherman, thank you for joining Chiquita HERO Barefoot, NP for today's virtual visit.  While this provider is not your primary care provider (PCP), if your PCP is located in our provider database this encounter information will be shared with them immediately following your visit.   A [facility] MyChart account gives you access to today's visit and all your visits, tests, and labs performed at [facility]  click here if you don't have a [facility] MyChart account or go to [email or web address]  Consent: (Patient) Jonathan Sherman provided verbal consent for this virtual visit at the beginning of the encounter.  Current Medications:  Current Outpatient Medications:    albuterol (VENTOLIN HFA) 108 (90 Base) MCG/ACT inhaler, Inhale 1-2 puffs into the lungs every 4 (four) hours as needed for wheezing or shortness of breath (cough)., Disp: 8 g, Rfl: 0   azithromycin (ZITHROMAX) 250 MG tablet, Take 2 tablets on day 1, then 1 tablet daily on days 2 through 5, Disp: 6 tablet, Rfl: 0   benzonatate (TESSALON) 200 MG capsule, Take 1 capsule (200 mg total) by mouth 3 (three) times daily as needed for cough., Disp: 30 capsule, Rfl: 0   predniSONE  (STERAPRED UNI-PAK 21 TAB) 10 MG (21) TBPK tablet, Take as directed, Disp: 21 tablet, Rfl: 0   promethazine-dextromethorphan (PROMETHAZINE-DM) 6.25-15 MG/5ML syrup, Take 5 mLs by mouth 4 (four) times daily as needed for cough., Disp: 118 mL, Rfl: 0   sodium chloride  (OCEAN) 0.65 % nasal spray, Place 1 spray into the nose as needed for congestion., Disp: 104 mL, Rfl: 0   FOLIC ACID PO, Take 300 mg by mouth daily. Take 2 tablets by mouth daily, Disp: , Rfl:    gabapentin (NEURONTIN) 300 MG capsule, Take 300 mg by mouth., Disp: , Rfl:    magnesium oxide (MAG-OX) 400 (240 Mg) MG tablet, Take 400 mg by mouth daily., Disp: , Rfl:    meloxicam  (MOBIC ) 15 MG tablet, TAKE 1 TABLET (15 MG TOTAL) BY MOUTH DAILY., Disp: 30 tablet, Rfl: 0   meloxicam  (MOBIC ) 7.5 MG  tablet, Take 7.5 mg by mouth daily., Disp: , Rfl:    metFORMIN  (GLUCOPHAGE ) 500 MG tablet, Take 1 tablet (500 mg total) by mouth 2 (two) times daily with a meal., Disp: 180 tablet, Rfl: 3   olmesartan  (BENICAR ) 20 MG tablet, Take 0.5 tablets (10 mg total) by mouth daily., Disp: 45 tablet, Rfl: 3   tamsulosin  (FLOMAX ) 0.4 MG CAPS capsule, Take 0.4 mg by mouth daily., Disp: , Rfl:    Vitamin D, Ergocalciferol, (DRISDOL) 1.25 MG (50000 UNIT) CAPS capsule, Take 50,000 Units by mouth once a week., Disp: , Rfl:    Medications ordered in this encounter:  Meds ordered this encounter  Medications   predniSONE  (STERAPRED UNI-PAK 21 TAB) 10 MG (21) TBPK tablet    Sig: Take as directed    Dispense:  21 tablet    Refill:  0    Supervising Provider:   LAMPTEY, PHILIP O [[id number]]   promethazine-dextromethorphan (PROMETHAZINE-DM) 6.25-15 MG/5ML syrup    Sig: Take 5 mLs by mouth 4 (four) times daily as needed for cough.    Dispense:  118 mL    Refill:  0    Supervising Provider:   BLAISE ALEENE KIDD [[id number]]   benzonatate (TESSALON) 200 MG capsule    Sig: Take 1 capsule (200 mg total) by mouth 3 (three) times daily as needed for cough.    Dispense:  30 capsule  Refill:  0    Supervising Provider:   BLAISE ALEENE KIDD [[id number]]   azithromycin (ZITHROMAX) 250 MG tablet    Sig: Take 2 tablets on day 1, then 1 tablet daily on days 2 through 5    Dispense:  6 tablet    Refill:  0    Supervising Provider:   LAMPTEY, PHILIP O [[id number]]   albuterol (VENTOLIN HFA) 108 (90 Base) MCG/ACT inhaler    Sig: Inhale 1-2 puffs into the lungs every 4 (four) hours as needed for wheezing or shortness of breath (cough).    Dispense:  8 g    Refill:  0    Supervising Provider:   LAMPTEY, PHILIP O [[id number]]   sodium chloride  (OCEAN) 0.65 % nasal spray    Sig: Place 1 spray into the nose as needed for congestion.    Dispense:  104 mL    Refill:  0    Supervising Provider:   BLAISE ALEENE KIDD [[id number]]     *If you  need refills on other medications prior to your next appointment, please contact your pharmacy*  Follow-Up: Call back or seek an in-person evaluation if the symptoms worsen or if the condition fails to improve as anticipated.  [facility] [phone]  Other Instructions  Take meds as prescribed - Rest voice - Use a cool mist humidifier especially during the winter months when heat dries out the air. - Use saline nose sprays frequently to help soothe nasal passages if they are drying out. - Stay hydrated by drinking plenty of fluids - Keep thermostat turn down low to prevent drying out which can cause a dry cough.  If you do not improve you will need a follow up visit in person.                If you have been instructed to have an in-person evaluation today at a local Urgent Care facility, please use the link below. It will take you to a list of all of our available [facility], including address, phone number and hours of operation. Please do not delay care.  [facility]  If you or a family member do not have a primary care provider, use the link below to schedule a visit and establish care. When you choose a [facility] primary care physician or advanced practice provider, you gain a long-term partner in health. Find a Primary Care Provider  Learn more about [facility]'s in-office and virtual care options: [facility] - Get Care Now

## 2024-04-23 NOTE — Progress Notes (Signed)
 Virtual Visit Consent   Jonathan Sherman, you are scheduled for a virtual visit with a [facility] provider today. Just as with appointments in the office, your consent must be obtained to participate. Your consent will be active for this visit and any virtual visit you may have with one of our providers in the next 365 days. If you have a MyChart account, a copy of this consent can be sent to you electronically.  As this is a virtual visit, video technology does not allow for your provider to perform a traditional examination. This may limit your provider's ability to fully assess your condition. If your provider identifies any concerns that need to be evaluated in person or the need to arrange testing (such as labs, EKG, etc.), we will make arrangements to do so. Although advances in technology are sophisticated, we cannot ensure that it will always work on either your end or our end. If the connection with a video visit is poor, the visit may have to be switched to a telephone visit. With either a video or telephone visit, we are not always able to ensure that we have a secure connection.  By engaging in this virtual visit, you consent to the provision of healthcare and authorize for your insurance to be billed (if applicable) for the services provided during this visit. Depending on your insurance coverage, you may receive a charge related to this service.  I need to obtain your verbal consent now. Are you willing to proceed with your visit today? Jonathan Sherman has provided verbal consent on 04/23/2024 for a virtual visit (video or telephone). Chiquita CHRISTELLA Barefoot, NP  Date: 04/23/2024 8:32 AM   Virtual Visit via Video Note   I, Chiquita CHRISTELLA Barefoot, connected with  Jonathan Sherman  ([id number], 06-20-1985) on 04/23/24 at  8:45 AM EST by a video-enabled telemedicine application and verified that I am speaking with the correct person using two identifiers.  Location: Patient: Virtual Visit Location Patient:  Home Provider: Virtual Visit Location Provider: Home Office   I discussed the limitations of evaluation and management by telemedicine and the availability of in person appointments. The patient expressed understanding and agreed to proceed.    History of Present Illness: Jonathan Sherman is a 38 y.o. who identifies as a male who was assigned male at birth, and is being seen today for persistent cough  Onset was 3 weeks ago with runny nose, congestion, cough never really improved  Associated symptoms are chest rib cage pain with coughing and shortness of breath due to cough. Dry mostly- and some mucus in the morning post laying down. Mucus has been some nasal bleeding some brown.   Modifying factors are robitussin, ny quil, mucinex, tussin  Denies fevers, chills, headaches  Exposure to sick contacts- unknown/known COVID test: neg  Vaccines: Flu in Oct  Problems:  Patient Active Problem List   Diagnosis Date Noted   Ventral hernia without obstruction or gangrene 04/05/2024   Generalized abdominal pain 01/24/2024   Elevated low density lipoprotein (LDL) cholesterol level 01/21/2024   Numbness of arm 03/27/2023   Prediabetes 02/08/2023   Family history of diabetes mellitus 02/07/2023   Essential hypertension 02/07/2023   OSA on CPAP 02/07/2023   Class 3 severe obesity due to excess calories without serious comorbidity with body mass index (BMI) of 50.0 to 59.9 in adult [facility]) 02/07/2023   Family history of melanoma 02/07/2023   History of cholecystectomy 03/08/2017    Allergies: No  Known Allergies Medications:  Current Outpatient Medications:    FOLIC ACID PO, Take 300 mg by mouth daily. Take 2 tablets by mouth daily, Disp: , Rfl:    gabapentin (NEURONTIN) 300 MG capsule, Take 300 mg by mouth., Disp: , Rfl:    magnesium oxide (MAG-OX) 400 (240 Mg) MG tablet, Take 400 mg by mouth daily., Disp: , Rfl:    meloxicam  (MOBIC ) 15 MG tablet, TAKE 1 TABLET (15 MG TOTAL) BY MOUTH DAILY., Disp: 30  tablet, Rfl: 0   meloxicam  (MOBIC ) 7.5 MG tablet, Take 7.5 mg by mouth daily., Disp: , Rfl:    metFORMIN  (GLUCOPHAGE ) 500 MG tablet, Take 1 tablet (500 mg total) by mouth 2 (two) times daily with a meal., Disp: 180 tablet, Rfl: 3   olmesartan  (BENICAR ) 20 MG tablet, Take 0.5 tablets (10 mg total) by mouth daily., Disp: 45 tablet, Rfl: 3   tamsulosin  (FLOMAX ) 0.4 MG CAPS capsule, Take 0.4 mg by mouth daily., Disp: , Rfl:    Vitamin D, Ergocalciferol, (DRISDOL) 1.25 MG (50000 UNIT) CAPS capsule, Take 50,000 Units by mouth once a week., Disp: , Rfl:   Observations/Objective: Patient is well-developed, well-nourished in no acute distress.  Resting comfortably  at home.  Head is normocephalic, atraumatic.  No labored breathing. Speech is clear and coherent with logical content.  Patient is alert and oriented at baseline.  Barky cough present  Assessment and Plan:  1. Acute bacterial bronchitis (Primary)  - predniSONE  (STERAPRED UNI-PAK 21 TAB) 10 MG (21) TBPK tablet; Take as directed  Dispense: 21 tablet; Refill: 0 - promethazine -dextromethorphan (PROMETHAZINE -DM) 6.25-15 MG/5ML syrup; Take 5 mLs by mouth 4 (four) times daily as needed for cough.  Dispense: 118 mL; Refill: 0 - benzonatate  (TESSALON ) 200 MG capsule; Take 1 capsule (200 mg total) by mouth 3 (three) times daily as needed for cough.  Dispense: 30 capsule; Refill: 0 - azithromycin  (ZITHROMAX ) 250 MG tablet; Take 2 tablets on day 1, then 1 tablet daily on days 2 through 5  Dispense: 6 tablet; Refill: 0 - albuterol  (VENTOLIN  HFA) 108 (90 Base) MCG/ACT inhaler; Inhale 1-2 puffs into the lungs every 4 (four) hours as needed for wheezing or shortness of breath (cough).  Dispense: 8 g; Refill: 0 - sodium chloride  (OCEAN) 0.65 % nasal spray; Place 1 spray into the nose as needed for congestion.  Dispense: 104 mL; Refill: 0   - Take meds as prescribed - Rest voice - Use a cool mist humidifier especially during the winter months when heat  dries out the air. - Use saline nose sprays frequently to help soothe nasal passages if they are drying out. - Stay hydrated by drinking plenty of fluids - Keep thermostat turn down low to prevent drying out which can cause a dry cough.  If you do not improve you will need a follow up visit in person.               Reviewed side effects, risks and benefits of medication.    Patient acknowledged agreement and understanding of the plan.   Past Medical, Surgical, Social History, Allergies, and Medications have been Reviewed.     Follow Up Instructions: I discussed the assessment and treatment plan with the patient. The patient was provided an opportunity to ask questions and all were answered. The patient agreed with the plan and demonstrated an understanding of the instructions.  A copy of instructions were sent to the patient via MyChart unless otherwise noted below.    The  patient was advised to call back or seek an in-person evaluation if the symptoms worsen or if the condition fails to improve as anticipated.    Chiquita CHRISTELLA Barefoot, NP

## 2024-05-04 NOTE — Patient Instructions (Signed)
 Be Involved in Caring For Your Health:  Taking Medications When medications are taken as directed, they can greatly improve your health. But if they are not taken as prescribed, they may not work. In some cases, not taking them correctly can be harmful. To help ensure your treatment remains effective and safe, understand your medications and how to take them. Bring your medications to each visit for review by your provider.  Your lab results, notes, and after visit summary will be available on My Chart. We strongly encourage you to use this feature. If lab results are abnormal the clinic will contact you with the appropriate steps. If the clinic does not contact you assume the results are satisfactory. You can always view your results on My Chart. If you have questions regarding your health or results, please contact the clinic during office hours. You can also ask questions on My Chart.  We at [facility] are grateful that you chose [place] to provide your care. We strive to provide evidence-based and compassionate care and are always looking for feedback. If you get a survey from the clinic please complete this so we can hear your opinions.  Prediabetes: Eating Plan Prediabetes is when your levels of blood sugar, also called glucose, are higher than normal. This can put you at risk for getting type 2 diabetes. When you have prediabetes, making healthy changes can help keep you from getting diabetes. This includes changes in your diet. Work with your health care provider or an expert in healthy eating called a dietitian. They can help you create a healthy eating plan. This plan can help you: Control your blood sugar levels. Improve your cholesterol levels. Manage your blood pressure. What are tips for following this plan? Reading food labels Read food labels to check the amount of fat and sugar in prepackaged foods. Avoid foods that have: Saturated fats. Trans fats. Added sugars. Check  food labels for the amount of salt (sodium). Avoid foods that have more than 300 milligrams (mg) of salt per serving. Limit your salt intake to less than 2,300 mg each day. Shopping Avoid buying pre-made and processed foods. Avoid buying drinks with added sugar. Cooking Cook with olive oil. Do not use: Butter. Lard. Ghee. Bake, broil, grill, steam, or boil foods. Avoid frying. Meal planning  Work with your dietitian to create an eating plan that's right for you. This may include tracking how many calories you take in each day. Use a food diary, notebook, or mobile app to track what you eat at each meal. Consider following a Mediterranean diet. This includes: Eating many servings of fresh fruits and vegetables each day. Eating fish at least twice a week. Eating one serving each day of whole grains, beans, nuts, and seeds. Using olive oil instead of other fats. Limiting alcohol. Limiting red meat. Using nonfat or low-fat dairy products. Consider following a plant-based diet. This means eating mostly: Vegetables and fruit. Grains. Beans. Nuts and seeds. If you have high blood pressure, you may need to limit your salt intake or follow a diet called the DASH eating plan. The DASH eating plan can help lower high blood pressure. Lifestyle Set weight loss goals with help from your health care team. Losing 7% of your body weight is a good goal for most people with prediabetes. Exercise for at least 30 minutes, 5 or more days a week. For support, think about joining a support group or talking with a mental health counselor. Take medicines only as  told. What foods are recommended? Fruits Berries. Bananas. Apples. Oranges. Grapes. Papaya. Mango. Pomegranate. Kiwi. Grapefruit. Cherries. Vegetables Lettuce. Spinach. Peas. Beets. Cauliflower. Cabbage. Broccoli. Carrots. Tomatoes. Squash. Eggplant. Herbs. Peppers. Onions. Cucumbers. Brussels sprouts. Grains Whole grains, such as whole-wheat  or whole-grain breads or pasta. Unsweetened oatmeal. Bulgur. Barley. Quinoa. Brown rice. Corn or whole-wheat flour tortillas or taco shells. Meats and other proteins Seafood. Poultry without skin. Lean cuts of pork and beef. Tofu. Eggs. Nuts. Beans. Dairy Low-fat or fat-free dairy products, such as yogurt, cottage cheese, and cheese. Beverages Water. Tea. Coffee. Sugar-free or diet soda. Seltzer water. Low-fat or nonfat milk. Milk alternatives, such as soy or almond milk. Fats and oils Olive oil. Canola oil. Sunflower oil. Grapeseed oil. Avocado. Walnuts. Sweets and desserts Sugar-free or low-fat pudding. Sugar-free or low-fat ice cream and other frozen treats. Seasonings and condiments Herbs. Salt-free spices. Mustard. Relish. Low-salt, low-sugar ketchup. Low-salt, low-sugar barbecue sauce. Low-fat or fat-free mayonnaise. The items listed above may not be all the foods and drinks you can have. Talk with a dietitian to learn more. What foods are not recommended? Fruits Fruits canned with syrup. Vegetables Canned vegetables. Frozen vegetables with butter or cream sauce. Grains Refined white flour and flour products, such as bread, pasta, snack foods, and cereals. Meats and other proteins Fatty cuts of meat. Poultry with skin. Breaded or fried meat. Processed meats. Dairy Full-fat yogurt, cheese, or milk. Beverages Sweetened drinks, such as iced tea and soda. Fats and oils Butter. Lard. Ghee. Sweets and desserts Baked goods, such as cake, cupcakes, pastries, cookies, and cheesecake. Seasonings and condiments Spice mixes with added salt. Ketchup. Barbecue sauce. Mayonnaise. The items listed above may not be all the foods and drinks you should avoid. Talk with a dietitian to learn more. Where to find more information American Diabetes Association: diabetes.org/food-nutrition This information is not intended to replace advice given to you by your health care provider. Make sure you  discuss any questions you have with your health care provider. Document Revised: 12/11/2022 Document Reviewed: 12/11/2022 Elsevier Patient Education  2024 [employer].

## 2024-05-06 ENCOUNTER — Ambulatory Visit: Admitting: Nurse Practitioner

## 2024-05-06 ENCOUNTER — Encounter: Payer: Self-pay | Admitting: Nurse Practitioner

## 2024-05-06 VITALS — BP 125/85 | HR 92 | Temp 98.6°F | Resp 18 | Ht 72.01 in | Wt >= 6400 oz

## 2024-05-06 DIAGNOSIS — R7303 Prediabetes: Secondary | ICD-10-CM | POA: Diagnosis not present

## 2024-05-06 DIAGNOSIS — E66813 Obesity, class 3: Secondary | ICD-10-CM

## 2024-05-06 DIAGNOSIS — Z6841 Body Mass Index (BMI) 40.0 and over, adult: Secondary | ICD-10-CM

## 2024-05-06 DIAGNOSIS — G4733 Obstructive sleep apnea (adult) (pediatric): Secondary | ICD-10-CM | POA: Diagnosis not present

## 2024-05-06 NOTE — Assessment & Plan Note (Signed)
 A1c today has come down a little from 6.2% to 6.1% last check, but is having diabetic symptoms and 2+ glucose in urine.  Family history of diabetes.  Continue Metformin  500 MG BID as he wishes to continue at this time, is having some side effects with this. Would benefit transition to alternate medication in future, like Zepbound with his OSA.

## 2024-05-06 NOTE — Assessment & Plan Note (Signed)
 Chronic, ongoing.  Consistently uses CPAP 100%, but it is not consistently working and is having some symptoms return. Repeat sleep study ordered to update, since machine not functioning 100% and weight gain of 60 lbs since last study over 4 years ago. Will also work on Zepbound coverage after obtain new sleep study results. Discussed at length with him.

## 2024-05-06 NOTE — Assessment & Plan Note (Signed)
 BMI 56.24, with 6 lbs lost since last visit while taking Metformin . Continue this for now, but goal is to obtain Zepbound in future after repeat sleep study performed. Zepbound being FDA approved for OSA and obesity.  Recommended eating smaller high protein, low fat meals more frequently and exercising 30 mins a day 5 times a week with a goal of 10-15lb weight loss in the next 3 months. Patient voiced their understanding and motivation to adhere to these recommendations.  No family history of thyroid  cancer (MTC, MEN 2, thyroid  cell tumors) or pancreatitis.

## 2024-05-06 NOTE — Progress Notes (Signed)
 BP 125/85 (BP Location: Left Arm, Patient Position: Sitting, Cuff Size: Large)   Pulse 92   Temp 98.6 F (37 C) (Oral)   Resp 18   Ht 6' 0.01 (1.829 m)   Wt (!) 414 lb 12.8 oz (188.2 kg)   SpO2 98%   BMI 56.24 kg/m    Subjective:    Patient ID: Jonathan Sherman, male    DOB: 17-Mar-1986, 38 y.o.   MRN: [id number]  HPI: Jonathan Sherman is a 38 y.o. male  Chief Complaint  Patient presents with   Prediabetes    Has had some nausea and some diarrhea. Does want to push through even though the Bm's can be sudden.     Sleep Apnea    Diagnosed 4 years ago but unable to obtain previous results from testing. Neck circumference is 20.5. Has gained roughly 60 pounds since previous testing.    PREDIABETES Started on Metformin  on 03/27/24 to assist with this and weight management. Has lost 6 lbs. Some diarrhea and nausea with this, but wishes to continue this.  Has known ventral hernia which is overall asymptomatic and recommendation from surgery on 04/08/24 for to monitor and work on weight loss. HbA1C:  Lab Results  Component Value Date   HGBA1C 6.1 (H) 03/27/2024  Duration of elevated blood sugar: chronic Polydipsia: no Polyuria: no Weight change: no Visual disturbance: no Glucose Monitoring: no    Accucheck frequency: Not Checking    Fasting glucose:     Post prandial:  Diabetic Education: Not Completed Family history of diabetes: yes  SLEEP APNEA Uses CPAP every night when it is working. Sometimes does a funky thing and cannot use it at all.  Follows with ENT. Has gained 60 lbs since last sleep study over 4 years ago. Sleep apnea status: uncontrolled Duration: chronic Satisfied with current treatment?:  yes CPAP use:  yes Sleep quality with CPAP use: fair Treament compliance:good compliance Last sleep study: 4 years ago, no available records. Treatments attempted: CPAP Wakes feeling refreshed:  no Daytime hypersomnolence:  no Fatigue:  yes more so than he used to  be Insomnia:  no Good sleep hygiene:  yes Difficulty falling asleep:  no Difficulty staying asleep:  no Snoring bothers bed partner:  yes Observed apnea by bed partner: yes Obesity:  yes Hypertension: yes  Pulmonary hypertension:  no Coronary artery disease:  no    05/06/2024    3:00 PM  Results of the Epworth flowsheet  Sitting and reading 2  Watching TV 1  Sitting, inactive in a public place (e.g. a theatre or a meeting) 2  As a passenger in a car for an hour without a break 0  Lying down to rest in the afternoon when circumstances permit 2  Sitting and talking to someone 0  Sitting quietly after a lunch without alcohol 1  In a car, while stopped for a few minutes in traffic 0  Total score 8     Relevant past medical, surgical, family and social history reviewed and updated as indicated. Interim medical history since our last visit reviewed. Allergies and medications reviewed and updated.  Review of Systems  Constitutional:  Negative for activity change, diaphoresis, fatigue and fever.  Respiratory:  Negative for cough, chest tightness, shortness of breath and wheezing.   Cardiovascular:  Negative for chest pain, palpitations and leg swelling.  Gastrointestinal:  Positive for diarrhea and nausea.  Neurological: Negative.   Psychiatric/Behavioral: Negative.      Per HPI  unless specifically indicated above     Objective:    BP 125/85 (BP Location: Left Arm, Patient Position: Sitting, Cuff Size: Large)   Pulse 92   Temp 98.6 F (37 C) (Oral)   Resp 18   Ht 6' 0.01 (1.829 m)   Wt (!) 414 lb 12.8 oz (188.2 kg)   SpO2 98%   BMI 56.24 kg/m   Wt Readings from Last 3 Encounters:  05/06/24 (!) 414 lb 12.8 oz (188.2 kg)  04/08/24 (!) 409 lb (185.5 kg)  03/27/24 (!) 415 lb (188.2 kg)    Physical Exam Vitals and nursing note reviewed.  Constitutional:      General: He is awake. He is not in acute distress.    Appearance: He is well-developed and well-groomed. He is  obese. He is not ill-appearing or toxic-appearing.  HENT:     Head: Normocephalic.     Right Ear: Hearing and external ear normal.     Left Ear: Hearing and external ear normal.  Eyes:     General: Lids are normal.     Extraocular Movements: Extraocular movements intact.     Conjunctiva/sclera: Conjunctivae normal.  Neck:     Thyroid : No thyromegaly.     Vascular: No carotid bruit.  Cardiovascular:     Rate and Rhythm: Normal rate and regular rhythm.     Heart sounds: Normal heart sounds. No murmur heard.    No gallop.  Pulmonary:     Effort: No accessory muscle usage or respiratory distress.     Breath sounds: Normal breath sounds.  Abdominal:     General: Bowel sounds are normal. There is no distension.     Palpations: Abdomen is soft.     Tenderness: There is no abdominal tenderness.  Musculoskeletal:     Cervical back: Full passive range of motion without pain.     Right lower leg: No edema.     Left lower leg: No edema.  Lymphadenopathy:     Cervical: No cervical adenopathy.  Skin:    General: Skin is warm.     Capillary Refill: Capillary refill takes less than 2 seconds.  Neurological:     Mental Status: He is alert and oriented to person, place, and time.     Deep Tendon Reflexes: Reflexes are normal and symmetric.     Reflex Scores:      Brachioradialis reflexes are 2+ on the right side and 2+ on the left side.      Patellar reflexes are 2+ on the right side and 2+ on the left side. Psychiatric:        Attention and Perception: Attention normal.        Mood and Affect: Mood normal.        Speech: Speech normal.        Behavior: Behavior normal. Behavior is cooperative.        Thought Content: Thought content normal.     Results for orders placed or performed in visit on 03/27/24  Microscopic Examination   Collection Time: 03/27/24  3:01 PM   Urine  Result Value Ref Range   WBC, UA 0-5 0 - 5 /hpf   RBC, Urine 0-2 0 - 2 /hpf   Epithelial Cells (non renal)  0-10 0 - 10 /hpf   Bacteria, UA None seen None seen/Few  Urinalysis, Routine w reflex microscopic   Collection Time: 03/27/24  3:01 PM  Result Value Ref Range   Specific Gravity, UA 1.020 1.005 -  1.030   pH, UA 6.5 5.0 - 7.5   Color, UA Yellow Yellow   Appearance Ur Clear Clear   Leukocytes,UA Negative Negative   Protein,UA Negative Negative/Trace   Glucose, UA 2+ (A) Negative   Ketones, UA Negative Negative   RBC, UA Trace (A) Negative   Bilirubin, UA Negative Negative   Urobilinogen, Ur 0.2 0.2 - 1.0 mg/dL   Nitrite, UA Negative Negative   Microscopic Examination See below:   Bayer DCA Hb A1c Waived   Collection Time: 03/27/24  3:31 PM  Result Value Ref Range   HB A1C (BAYER DCA - WAIVED) 6.1 (H) 4.8 - 5.6 %      Assessment & Plan:   Problem List Items Addressed This Visit       Respiratory   OSA on CPAP   Chronic, ongoing.  Consistently uses CPAP 100%, but it is not consistently working and is having some symptoms return. Repeat sleep study ordered to update, since machine not functioning 100% and weight gain of 60 lbs since last study over 4 years ago. Will also work on Zepbound coverage after obtain new sleep study results. Discussed at length with him.      Relevant Orders   Ambulatory referral to Sleep Studies     Other   Prediabetes   A1c today has come down a little from 6.2% to 6.1% last check, but is having diabetic symptoms and 2+ glucose in urine.  Family history of diabetes.  Continue Metformin  500 MG BID as he wishes to continue at this time, is having some side effects with this. Would benefit transition to alternate medication in future, like Zepbound with his OSA.      Class 3 severe obesity due to excess calories without serious comorbidity with body mass index (BMI) of 50.0 to 59.9 in adult (HCC) - Primary   BMI 56.24, with 6 lbs lost since last visit while taking Metformin . Continue this for now, but goal is to obtain Zepbound in future after repeat  sleep study performed. Zepbound being FDA approved for OSA and obesity.  Recommended eating smaller high protein, low fat meals more frequently and exercising 30 mins a day 5 times a week with a goal of 10-15lb weight loss in the next 3 months. Patient voiced their understanding and motivation to adhere to these recommendations.  No family history of thyroid  cancer (MTC, MEN 2, thyroid  cell tumors) or pancreatitis.            Follow up plan: Return in about 7 weeks (around 06/24/2024) for OSA AND WEIGHT CHECK.

## 2024-06-20 ENCOUNTER — Encounter: Payer: Self-pay | Admitting: Dermatology

## 2024-06-20 ENCOUNTER — Ambulatory Visit: Payer: Commercial Managed Care - PPO | Admitting: Dermatology

## 2024-06-20 DIAGNOSIS — L814 Other melanin hyperpigmentation: Secondary | ICD-10-CM | POA: Diagnosis not present

## 2024-06-20 DIAGNOSIS — L821 Other seborrheic keratosis: Secondary | ICD-10-CM

## 2024-06-20 DIAGNOSIS — Z1283 Encounter for screening for malignant neoplasm of skin: Secondary | ICD-10-CM

## 2024-06-20 DIAGNOSIS — W908XXA Exposure to other nonionizing radiation, initial encounter: Secondary | ICD-10-CM

## 2024-06-20 DIAGNOSIS — Z808 Family history of malignant neoplasm of other organs or systems: Secondary | ICD-10-CM

## 2024-06-20 DIAGNOSIS — D1801 Hemangioma of skin and subcutaneous tissue: Secondary | ICD-10-CM

## 2024-06-20 DIAGNOSIS — D229 Melanocytic nevi, unspecified: Secondary | ICD-10-CM

## 2024-06-20 DIAGNOSIS — L918 Other hypertrophic disorders of the skin: Secondary | ICD-10-CM

## 2024-06-20 DIAGNOSIS — L578 Other skin changes due to chronic exposure to nonionizing radiation: Secondary | ICD-10-CM | POA: Diagnosis not present

## 2024-06-20 NOTE — Patient Instructions (Signed)

## 2024-06-20 NOTE — Progress Notes (Signed)
 "  New Patient Visit   Subjective  Jonathan Sherman is a 39 y.o. male who presents for the following: Skin Cancer Screening and Full Body Skin Exam patient is concerned with many skin tags mostly on neck and right axilla that he states is very big. States he is getting more skin tags. Rubbed and irritated by clothing. Bleed at times.   Hard bump on right abdomen he states present a few years.   Patient reports a family history of skin cancer maternal grandfather and mother who had passed away with melanoma.    The patient presents for Total-Body Skin Exam (TBSE) for skin cancer screening and mole check. The patient has spots, moles and lesions to be evaluated, some may be new or changing and the patient may have concern these could be cancer.    The following portions of the chart were reviewed this encounter and updated as appropriate: medications, allergies, medical history  Review of Systems:  No other skin or systemic complaints except as noted in HPI or Assessment and Plan.  Objective  Well appearing patient in no apparent distress; mood and affect are within normal limits.  A full examination was performed including scalp, head, eyes, ears, nose, lips, neck, chest, axillae, abdomen, back, buttocks, bilateral upper extremities, bilateral lower extremities, hands, feet, fingers, toes, fingernails, and toenails. All findings within normal limits unless otherwise noted below.   Relevant physical exam findings are noted in the Assessment and Plan.    Assessment & Plan   SKIN CANCER SCREENING PERFORMED TODAY.  ACTINIC DAMAGE - Chronic condition, secondary to cumulative UV/sun exposure - diffuse scaly erythematous macules with underlying dyspigmentation - Recommend daily broad spectrum sunscreen SPF 30+ to sun-exposed areas, reapply every 2 hours as needed.  - Staying in the shade or wearing long sleeves, sun glasses (UVA+UVB protection) and wide brim hats (4-inch brim around the  entire circumference of the hat) are also recommended for sun protection.  - Call for new or changing lesions.  LENTIGINES, SEBORRHEIC KERATOSES, HEMANGIOMAS - Benign normal skin lesions - Benign-appearing - Call for any changes  MELANOCYTIC NEVI - Tan-brown and/or pink-flesh-colored symmetric macules and papules - Benign appearing on exam today - Observation - Call clinic for new or changing moles - Recommend daily use of broad spectrum spf 30+ sunscreen to sun-exposed areas.   Acrochordons (Skin Tags) At neck and right axilla  - Fleshy, skin-colored pedunculated papules - Benign appearing.  - Observe. - If desired, they can be removed with an in office procedure that is not covered by insurance. - Please call the clinic if you notice any new or changing lesions.  Skin tag removal generally is considered cosmetic and not covered by insurance.  Cosmetic removal fee is $115 for up to 15 tags removed. You can contact your insurance to see if skin tag removal is a medically necessary covered benefit.  Even if covered, copay and deductible payments would apply. CPT code: 88799 Diagnosis code: L91.8    FAMILY HISTORY OF SKIN CANCER What type(s):patient unsure with grandfather, melanoma  Who affected:maternal grandfather , mother had melanoma at neck and passed away  MULTIPLE BENIGN NEVI   LENTIGINES   ACTINIC ELASTOSIS   SEBORRHEIC KERATOSES   CHERRY ANGIOMA   ACROCHORDON    Return in about 1 year (around 06/20/2025) for TBSE.  I, Kate Fought, CMA, am acting as scribe for Boneta Sharps, MD.    Documentation: I have reviewed the above documentation for accuracy and completeness,  and I agree with the above.  Boneta Sharps, MD    "

## 2024-06-22 NOTE — Patient Instructions (Incomplete)
 Be Involved in Caring For Your Health:  Taking Medications When medications are taken as directed, they can greatly improve your health. But if they are not taken as prescribed, they may not work. In some cases, not taking them correctly can be harmful. To help ensure your treatment remains effective and safe, understand your medications and how to take them. Bring your medications to each visit for review by your provider.  Your lab results, notes, and after visit summary will be available on My Chart. We strongly encourage you to use this feature. If lab results are abnormal the clinic will contact you with the appropriate steps. If the clinic does not contact you assume the results are satisfactory. You can always view your results on My Chart. If you have questions regarding your health or results, please contact the clinic during office hours. You can also ask questions on My Chart.  We at [facility] are grateful that you chose [place] to provide your care. We strive to provide evidence-based and compassionate care and are always looking for feedback. If you get a survey from the clinic please complete this so we can hear your opinions.  Prediabetes: Eating Plan Prediabetes is when your levels of blood sugar, also called glucose, are higher than normal. This can put you at risk for getting type 2 diabetes. When you have prediabetes, making healthy changes can help keep you from getting diabetes. This includes changes in your diet. Work with your health care provider or an expert in healthy eating called a dietitian. They can help you create a healthy eating plan. This plan can help you: Control your blood sugar levels. Improve your cholesterol levels. Manage your blood pressure. What are tips for following this plan? Reading food labels Read food labels to check the amount of fat and sugar in prepackaged foods. Avoid foods that have: Saturated fats. Trans fats. Added sugars. Check  food labels for the amount of salt (sodium). Avoid foods that have more than 300 milligrams (mg) of salt per serving. Limit your salt intake to less than 2,300 mg each day. Shopping Avoid buying pre-made and processed foods. Avoid buying drinks with added sugar. Cooking Cook with olive oil. Do not use: Butter. Lard. Ghee. Bake, broil, grill, steam, or boil foods. Avoid frying. Meal planning  Work with your dietitian to create an eating plan that's right for you. This may include tracking how many calories you take in each day. Use a food diary, notebook, or mobile app to track what you eat at each meal. Consider following a Mediterranean diet. This includes: Eating many servings of fresh fruits and vegetables each day. Eating fish at least twice a week. Eating one serving each day of whole grains, beans, nuts, and seeds. Using olive oil instead of other fats. Limiting alcohol. Limiting red meat. Using nonfat or low-fat dairy products. Consider following a plant-based diet. This means eating mostly: Vegetables and fruit. Grains. Beans. Nuts and seeds. If you have high blood pressure, you may need to limit your salt intake or follow a diet called the DASH eating plan. The DASH eating plan can help lower high blood pressure. Lifestyle Set weight loss goals with help from your health care team. Losing 7% of your body weight is a good goal for most people with prediabetes. Exercise for at least 30 minutes, 5 or more days a week. For support, think about joining a support group or talking with a mental health counselor. Take medicines only as  told. What foods are recommended? Fruits Berries. Bananas. Apples. Oranges. Grapes. Papaya. Mango. Pomegranate. Kiwi. Grapefruit. Cherries. Vegetables Lettuce. Spinach. Peas. Beets. Cauliflower. Cabbage. Broccoli. Carrots. Tomatoes. Squash. Eggplant. Herbs. Peppers. Onions. Cucumbers. Brussels sprouts. Grains Whole grains, such as whole-wheat  or whole-grain breads or pasta. Unsweetened oatmeal. Bulgur. Barley. Quinoa. Brown rice. Corn or whole-wheat flour tortillas or taco shells. Meats and other proteins Seafood. Poultry without skin. Lean cuts of pork and beef. Tofu. Eggs. Nuts. Beans. Dairy Low-fat or fat-free dairy products, such as yogurt, cottage cheese, and cheese. Beverages Water. Tea. Coffee. Sugar-free or diet soda. Seltzer water. Low-fat or nonfat milk. Milk alternatives, such as soy or almond milk. Fats and oils Olive oil. Canola oil. Sunflower oil. Grapeseed oil. Avocado. Walnuts. Sweets and desserts Sugar-free or low-fat pudding. Sugar-free or low-fat ice cream and other frozen treats. Seasonings and condiments Herbs. Salt-free spices. Mustard. Relish. Low-salt, low-sugar ketchup. Low-salt, low-sugar barbecue sauce. Low-fat or fat-free mayonnaise. The items listed above may not be all the foods and drinks you can have. Talk with a dietitian to learn more. What foods are not recommended? Fruits Fruits canned with syrup. Vegetables Canned vegetables. Frozen vegetables with butter or cream sauce. Grains Refined white flour and flour products, such as bread, pasta, snack foods, and cereals. Meats and other proteins Fatty cuts of meat. Poultry with skin. Breaded or fried meat. Processed meats. Dairy Full-fat yogurt, cheese, or milk. Beverages Sweetened drinks, such as iced tea and soda. Fats and oils Butter. Lard. Ghee. Sweets and desserts Baked goods, such as cake, cupcakes, pastries, cookies, and cheesecake. Seasonings and condiments Spice mixes with added salt. Ketchup. Barbecue sauce. Mayonnaise. The items listed above may not be all the foods and drinks you should avoid. Talk with a dietitian to learn more. Where to find more information American Diabetes Association: diabetes.org/food-nutrition This information is not intended to replace advice given to you by your health care provider. Make sure you  discuss any questions you have with your health care provider. Document Revised: 12/11/2022 Document Reviewed: 12/11/2022 Elsevier Patient Education  2024 [employer].

## 2024-06-24 ENCOUNTER — Ambulatory Visit: Admitting: Nurse Practitioner

## 2024-06-24 DIAGNOSIS — Z6841 Body Mass Index (BMI) 40.0 and over, adult: Secondary | ICD-10-CM

## 2024-06-24 DIAGNOSIS — R7303 Prediabetes: Secondary | ICD-10-CM

## 2024-06-24 DIAGNOSIS — I10 Essential (primary) hypertension: Secondary | ICD-10-CM

## 2024-06-26 ENCOUNTER — Ambulatory Visit: Admitting: Nurse Practitioner

## 2024-06-26 ENCOUNTER — Encounter: Payer: Self-pay | Admitting: Nurse Practitioner

## 2024-06-26 VITALS — BP 132/86 | HR 97 | Temp 98.9°F | Ht 72.0 in | Wt >= 6400 oz

## 2024-06-26 DIAGNOSIS — Z6841 Body Mass Index (BMI) 40.0 and over, adult: Secondary | ICD-10-CM

## 2024-06-26 DIAGNOSIS — R7303 Prediabetes: Secondary | ICD-10-CM | POA: Diagnosis not present

## 2024-06-26 DIAGNOSIS — E66813 Obesity, class 3: Secondary | ICD-10-CM

## 2024-06-26 DIAGNOSIS — G4733 Obstructive sleep apnea (adult) (pediatric): Secondary | ICD-10-CM

## 2024-06-26 DIAGNOSIS — M79671 Pain in right foot: Secondary | ICD-10-CM | POA: Diagnosis not present

## 2024-06-26 DIAGNOSIS — M79672 Pain in left foot: Secondary | ICD-10-CM

## 2024-06-26 NOTE — Assessment & Plan Note (Addendum)
 Patient is on metformin , patient express doing ok with medication with minimal manageable side effect. Plan to get patient on mounjaro , if possible, for weight management, once sleep study accomplish and CPAP approved

## 2024-06-26 NOTE — Assessment & Plan Note (Signed)
 BMI 55.96, with 2 lbs lost since last visit while taking Metformin . Continue this for now, but goal is to obtain Zepbound . Zepbound  being FDA approved for OSA and obesity.  Recommended eating smaller high protein, low fat meals more frequently and exercising 30 mins a day 5 times a week with a goal of 10-15lb weight loss in the next 3 months. Patient voiced their understanding and motivation to adhere to these recommendations.  No family history of thyroid  cancer (MTC, MEN 2, thyroid  cell tumors) or pancreatitis.

## 2024-06-26 NOTE — Assessment & Plan Note (Signed)
 Patient reported on this visit a worsening bilateral foot pain. Patient agreed for a second opinion referral. Patient will be referred to another podiatrist

## 2024-06-26 NOTE — Progress Notes (Deleted)
 "  BP 132/86   Pulse 97   Temp 98.9 F (37.2 C) (Oral)   Ht 6' (1.829 m)   Wt (!) 412 lb 9.6 oz (187.2 kg)   SpO2 95%   BMI 55.96 kg/m    Subjective:    Patient ID: Jonathan Sherman, male    DOB: January 23, 1986, 39 y.o.   MRN: [id number]  HPI: Jonathan Sherman is a 39 y.o. male  Chief Complaint  Patient presents with   Sleep Apnea   Obesity   Impaired Fasting Glucose Was started on Metformin  03/27/24 to assist with sugars and weight management. Has lost 2 lbs since last visit. Known ventral hernia overall asymptomatic and recommendation from surgery on 04/08/24 was to monitor and work on weight loss.  HbA1C:  Lab Results  Component Value Date   HGBA1C 6.1 (H) 03/27/2024  Duration of elevated blood sugar:  Polydipsia: {Blank single:19197::yes,no} Polyuria: {Blank single:19197::yes,no} Weight change: {Blank single:19197::yes,no} Visual disturbance: {Blank single:19197::yes,no} Glucose Monitoring: {Blank single:19197::yes,no}    Accucheck frequency: {Blank single:19197::Not Checking,Daily,BID,TID}    Fasting glucose:     Post prandial:  Diabetic Education: {Blank single:19197::Completed,Not Completed} Family history of diabetes: {Blank single:19197::yes,no}   SLEEP APNEA New sleep study ordered on 05/06/24, due to weight gain and machine not working. Sleep apnea status: {Blank multiple:19196::uncontrolled,controlled,better,worse,stable,fluctuating} Duration: {Blank single:19197::chronic,days,weeks,months} Satisfied with current treatment?:  {Blank single:19197::yes,no} CPAP use:  {Blank single:19197::yes,no} Sleep quality with CPAP use: {Blank single:19197::excellent,good, average,poor} Treament compliance:{Blank single:19197::excellent compliance,good compliance,fair compliance,poor compliance} Last sleep study:  Treatments attempted:  Wakes feeling refreshed:  {Blank single:19197::yes,no} Daytime  hypersomnolence:  {Blank single:19197::yes,no} Fatigue:  {Blank single:19197::yes,no} Insomnia:  {Blank single:19197::yes,no} Good sleep hygiene:  {Blank single:19197::yes,no} Difficulty falling asleep:  {Blank single:19197::yes,no} Difficulty staying asleep:  {Blank single:19197::yes,no} Snoring bothers bed partner:  {Blank single:19197::yes,no} Observed apnea by bed partner: {Blank single:19197::yes,no} Obesity:  {Blank single:19197::yes,no} Hypertension: {Blank single:19197::yes,no}  Pulmonary hypertension:  {Blank single:19197::yes,no} Coronary artery disease:  {Blank single:19197::yes,no}    05/06/2024    3:00 PM  Results of the Epworth flowsheet  Sitting and reading 2  Watching TV 1  Sitting, inactive in a public place (e.g. a theatre or a meeting) 2  As a passenger in a car for an hour without a break 0  Lying down to rest in the afternoon when circumstances permit 2  Sitting and talking to someone 0  Sitting quietly after a lunch without alcohol 1  In a car, while stopped for a few minutes in traffic 0  Total score 8    Relevant past medical, surgical, family and social history reviewed and updated as indicated. Interim medical history since our last visit reviewed. Allergies and medications reviewed and updated.  Review of Systems  Per HPI unless specifically indicated above     Objective:    BP 132/86   Pulse 97   Temp 98.9 F (37.2 C) (Oral)   Ht 6' (1.829 m)   Wt (!) 412 lb 9.6 oz (187.2 kg)   SpO2 95%   BMI 55.96 kg/m   Wt Readings from Last 3 Encounters:  06/26/24 (!) 412 lb 9.6 oz (187.2 kg)  05/06/24 (!) 414 lb 12.8 oz (188.2 kg)  04/08/24 (!) 409 lb (185.5 kg)    Physical Exam  Results for orders placed or performed in visit on 03/27/24  Microscopic Examination   Collection Time: 03/27/24  3:01 PM   Urine  Result Value Ref Range   WBC, UA 0-5 0 - 5 /hpf   RBC, Urine  0-2 0 - 2 /hpf   Epithelial  Cells (non renal) 0-10 0 - 10 /hpf   Bacteria, UA None seen None seen/Few  Urinalysis, Routine w reflex microscopic   Collection Time: 03/27/24  3:01 PM  Result Value Ref Range   Specific Gravity, UA 1.020 1.005 - 1.030   pH, UA 6.5 5.0 - 7.5   Color, UA Yellow Yellow   Appearance Ur Clear Clear   Leukocytes,UA Negative Negative   Protein,UA Negative Negative/Trace   Glucose, UA 2+ (A) Negative   Ketones, UA Negative Negative   RBC, UA Trace (A) Negative   Bilirubin, UA Negative Negative   Urobilinogen, Ur 0.2 0.2 - 1.0 mg/dL   Nitrite, UA Negative Negative   Microscopic Examination See below:   Bayer DCA Hb A1c Waived   Collection Time: 03/27/24  3:31 PM  Result Value Ref Range   HB A1C (BAYER DCA - WAIVED) 6.1 (H) 4.8 - 5.6 %      Assessment & Plan:   Problem List Items Addressed This Visit   None    Follow up plan: No follow-ups on file.      "

## 2024-06-26 NOTE — Assessment & Plan Note (Signed)
 Patient was on CPAP before the cpap machine got broken; patient reported having the repeat sleep study done as ordered. Plan is to review the study result and order CPAP machine for patient.

## 2024-06-26 NOTE — Patient Instructions (Signed)
 Sleep Apnea: How to Manage Sleep apnea is a condition that affects your breathing while you're sleeping. Your tongue or the tissue in your throat may block the flow of air while you sleep. You may have shallow breathing or stop breathing for short periods of time. The breaks in breathing interrupt the deep sleep that you need to feel rested. Even if you don't wake up from the gaps in breathing, you may feel tired during the day. People with sleep apnea may snore loudly. You may have a headache in the morning and feel anxious or depressed. How can sleep apnea affect me? Sleep apnea increases your chances of being very tired during the day. This is called daytime fatigue. Sleep apnea can also increase your risk of: Heart attack. Stroke. Obesity. Type 2 diabetes. Heart failure. Irregular heartbeat. High blood pressure. If you are very tired during the day, you may be more likely to: Not do well in school or at work. Fall asleep while driving. Have trouble paying attention. Develop depression or anxiety. Have problems having sex. This is called sexual dysfunction. What actions can I take to manage sleep apnea? Sleep apnea treatment  If you were given a device to open your airway while you sleep, use it only as told by your health care provider. You may be given: An oral appliance. This is a mouthpiece that shifts your lower jaw forward. A continuous positive airway pressure (CPAP) device. This blows air through a mask. A nasal expiratory positive airway pressure (EPAP) device. This has valves that you put into each nostril. A bi-level positive airway pressure (BIPAP) device. This blows air through a mask when you breathe in and breathe out. You may need surgery if other treatments don't work for you. Sleep habits Go to sleep and wake up at the same time every day. This helps set your internal clock for sleeping. If you stay up later than usual on weekends, try to get up in the morning  within 2 hours of the time you usually wake up. Try to get at least 7-9 hours of sleep each night. Stop using a computer, tablet, and mobile phone a few hours before bedtime. Do not take long naps during the day. If you nap, limit it to 30 minutes. Have a relaxing bedtime routine. Reading or listening to music may relax you and help you sleep. Use your bedroom only for sleep. Keep your television and computer out of your bedroom. Keep your bedroom cool, dark, and quiet. Use a supportive mattress and pillows. Follow your provider's instructions for other changes to sleep habits. Nutrition Do not eat big meals in the evening. Do not have caffeine in the later part of the day. The effects of caffeine can last for more than 5 hours. Follow your provider's instructions for any changes to what you eat and drink. Lifestyle Do not drink alcohol before bedtime. Alcohol can cause you to fall asleep at first, but then it can cause you to wake up in the middle of the night and have trouble getting back to sleep. Do not smoke, vape, or use nicotine or tobacco. Medicines Take over-the-counter and prescription medicines only as told by your provider. Do not use over-the-counter sleep medicine. You may become dependent on this medicine, and it can make sleep apnea worse. Do not take medicines, such as sedatives and narcotics, unless told to by your provider. Activity Exercise on most days, but avoid exercising in the evening. Exercising near bedtime can interfere with  sleeping. If possible, spend time outside every day. Natural light helps with your internal clock. General information Lose weight if you need to. Stay at a healthy weight. If you are having surgery, make sure to tell your provider that you have sleep apnea. You may need to bring your device with you. Keep all follow-up visits. Your provider will want to check on your condition. Where to find more information National Heart, Lung, and  Blood Institute: buffalodrycleaner.gl This information is not intended to replace advice given to you by your health care provider. Make sure you discuss any questions you have with your health care provider. Document Revised: 03/19/2024 Document Reviewed: 08/31/2022 Elsevier Patient Education  2025 [employer].

## 2024-06-26 NOTE — Progress Notes (Signed)
 "  BP 132/86   Pulse 97   Temp 98.9 F (37.2 C) (Oral)   Ht 6' (1.829 m)   Wt (!) 412 lb 9.6 oz (187.2 kg)   SpO2 95%   BMI 55.96 kg/m    Subjective:    Patient ID: Jonathan Sherman, male    DOB: [birth year], 39 y.o.   MRN: [id number]  HPI: Jonathan Sherman is a 39 y.o. male  Chief Complaint  Patient presents with   Sleep Apnea   Obesity   Impaired Fasting Glucose Was started on Metformin  03/27/24 to assist with sugars and weight management. Has lost 2 lbs since last visit. Known ventral hernia overall asymptomatic and recommendation from surgery on 04/08/24 was to monitor and work on weight loss.  HbA1C:  Lab Results  Component Value Date   HGBA1C 6.1 (H) 03/27/2024  Duration of elevated blood sugar:  Polydipsia: no Polyuria: yes Weight change: no Visual disturbance: no Glucose Monitoring: no    Accucheck frequency: Not Checking    Fasting glucose:     Post prandial:  Diabetic Education: Completed Family history of diabetes: yes   SLEEP APNEA Patient reported that the sleep study is done. We will look into the result New sleep study ordered on 05/06/24, due to weight gain and machine not working. Sleep apnea status: worse Duration: months Satisfied with current treatment?:  no CPAP use:  no Sleep quality with CPAP use: poor Treament compliance:patient is waiting for the sleep machine to be ordered. Patient like to use the sleep machine; stated the machine help a lot Last sleep study:  Treatments attempted:  Wakes feeling refreshed:  feeling like he has not sleep at all Daytime hypersomnolence:  yes Fatigue:  yes Insomnia:  yes Good sleep hygiene:  patient stated he just can't sleep, without the CPAP Difficulty falling asleep:  no Difficulty staying asleep:  yes Snoring bothers bed partner:  patient stated he snore all the time even awake Observed apnea by bed partner: yes Obesity:  yes Hypertension: yes  Pulmonary hypertension:  no Coronary artery disease:  no     05/06/2024    3:00 PM  Results of the Epworth flowsheet  Sitting and reading 2  Watching TV 1  Sitting, inactive in a public place (e.g. a theatre or a meeting) 2  As a passenger in a car for an hour without a break 0  Lying down to rest in the afternoon when circumstances permit 2  Sitting and talking to someone 0  Sitting quietly after a lunch without alcohol 1  In a car, while stopped for a few minutes in traffic 0  Total score 8    Relevant past medical, surgical, family and social history reviewed and updated as indicated. Interim medical history since our last visit reviewed. Allergies and medications reviewed and updated.  Review of Systems  Constitutional:  Positive for fatigue. Negative for activity change, appetite change, chills, fever and unexpected weight change.  HENT:  Negative for drooling.   Respiratory:  Positive for apnea. Negative for cough, choking, chest tightness and wheezing.   Cardiovascular:  Negative for chest pain, palpitations and leg swelling.  Musculoskeletal:  Negative for back pain, gait problem and joint swelling.  Allergic/Immunologic: Negative for environmental allergies.  Neurological:  Negative for dizziness, numbness and headaches.  Hematological:  Negative for adenopathy.  Psychiatric/Behavioral:  Negative for self-injury and suicidal ideas. The patient is not nervous/anxious.     Per HPI unless specifically indicated above  Objective:    BP 132/86   Pulse 97   Temp 98.9 F (37.2 C) (Oral)   Ht 6' (1.829 m)   Wt (!) 412 lb 9.6 oz (187.2 kg)   SpO2 95%   BMI 55.96 kg/m   Wt Readings from Last 3 Encounters:  06/26/24 (!) 412 lb 9.6 oz (187.2 kg)  05/06/24 (!) 414 lb 12.8 oz (188.2 kg)  04/08/24 (!) 409 lb (185.5 kg)    Physical Exam Constitutional:      General: He is awake.     Appearance: Normal appearance. He is well-groomed. He is obese.  HENT:     Head: Normocephalic and atraumatic.     Jaw: No tenderness, swelling or  pain on movement.     Salivary Glands: Right salivary gland is not diffusely enlarged or tender. Left salivary gland is not diffusely enlarged or tender.     Nose: Nose normal.     Mouth/Throat:     Pharynx: Oropharynx is clear.  Eyes:     General: Lids are normal. Vision grossly intact. Gaze aligned appropriately.     Conjunctiva/sclera:     Right eye: No exudate or hemorrhage.    Left eye: No exudate or hemorrhage.    Pupils: Pupils are equal, round, and reactive to light.  Neck:     Thyroid : No thyroid  mass or thyroid  tenderness.     Vascular: Normal carotid pulses. No carotid bruit or hepatojugular reflux.     Trachea: Trachea normal.  Cardiovascular:     Rate and Rhythm: Normal rate and regular rhythm.     Pulses:          Carotid pulses are 1+ on the right side and 1+ on the left side.      Radial pulses are 1+ on the right side and 1+ on the left side.     Heart sounds: Normal heart sounds, S1 normal and S2 normal. Heart sounds not distant. No murmur heard.    No gallop. No S3 or S4 sounds.  Pulmonary:     Effort: Pulmonary effort is normal. No respiratory distress.     Breath sounds: Normal breath sounds. No stridor or decreased air movement. No wheezing or rales.  Chest:     Chest wall: No tenderness.  Breasts:    Right: Normal.     Left: Normal.  Abdominal:     General: Bowel sounds are normal. There is no distension.     Palpations: Abdomen is soft.     Tenderness: There is no abdominal tenderness. There is no guarding.     Hernia: No hernia is present.  Musculoskeletal:        General: No swelling or deformity.     Cervical back: Normal range of motion. No edema or erythema.     Right lower leg: No edema.     Left lower leg: No edema.     Right foot: Normal range of motion. No deformity.     Left foot: Normal range of motion. No deformity.  Feet:     Comments: Patient reported bilateral dorsalis pain, patient would like pain to be reviewed referral will be done  for podiatrist consultation Lymphadenopathy:     Head:     Right side of head: No submandibular adenopathy.     Left side of head: No submandibular adenopathy.     Cervical: No cervical adenopathy.     Right cervical: No superficial, deep or posterior cervical adenopathy.  Left cervical: No superficial, deep or posterior cervical adenopathy.     Upper Body:     Right upper body: No supraclavicular adenopathy.     Left upper body: No supraclavicular adenopathy.  Skin:    General: Skin is warm and dry.     Capillary Refill: Capillary refill takes less than 2 seconds.     Coloration: Skin is not jaundiced.     Findings: No bruising, erythema or rash.  Neurological:     General: No focal deficit present.     Mental Status: He is alert and oriented to person, place, and time.     Cranial Nerves: Cranial nerves 2-12 are intact.     Sensory: No sensory deficit.     Motor: No weakness or tremor.     Coordination: Coordination is intact.     Gait: Gait is intact. Gait normal.     Deep Tendon Reflexes: Reflexes are normal and symmetric.     Reflex Scores:      Patellar reflexes are 2+ on the right side and 2+ on the left side. Psychiatric:        Attention and Perception: Attention and perception normal.        Mood and Affect: Mood normal.        Speech: Speech normal.        Behavior: Behavior normal. Behavior is cooperative.        Thought Content: Thought content normal.        Cognition and Memory: Cognition normal.        Judgment: Judgment normal.    Results for orders placed or performed in visit on 03/27/24  Microscopic Examination   Collection Time: 03/27/24  3:01 PM   Urine  Result Value Ref Range   WBC, UA 0-5 0 - 5 /hpf   RBC, Urine 0-2 0 - 2 /hpf   Epithelial Cells (non renal) 0-10 0 - 10 /hpf   Bacteria, UA None seen None seen/Few  Urinalysis, Routine w reflex microscopic   Collection Time: 03/27/24  3:01 PM  Result Value Ref Range   Specific Gravity, UA 1.020  1.005 - 1.030   pH, UA 6.5 5.0 - 7.5   Color, UA Yellow Yellow   Appearance Ur Clear Clear   Leukocytes,UA Negative Negative   Protein,UA Negative Negative/Trace   Glucose, UA 2+ (A) Negative   Ketones, UA Negative Negative   RBC, UA Trace (A) Negative   Bilirubin, UA Negative Negative   Urobilinogen, Ur 0.2 0.2 - 1.0 mg/dL   Nitrite, UA Negative Negative   Microscopic Examination See below:   Bayer DCA Hb A1c Waived   Collection Time: 03/27/24  3:31 PM  Result Value Ref Range   HB A1C (BAYER DCA - WAIVED) 6.1 (H) 4.8 - 5.6 %      Assessment & Plan:   Problem List Items Addressed This Visit       Respiratory   OSA on CPAP   Patient was on CPAP before the cpap machine got broken; patient reported having the repeat sleep study done as ordered. Plan is to review the study result and order CPAP machine for patient.         Other   Prediabetes   Patient is on metformin , patient express doing ok with medication with minimal manageable side effect. Plan to get patient on mounjaro , if possible, for weight management, once sleep study accomplish and CPAP approved       Class 3 severe  obesity due to excess calories without serious comorbidity with body mass index (BMI) of 50.0 to 59.9 in adult (HCC) - Primary   BMI 55.96, with 2 lbs lost since last visit while taking Metformin . Continue this for now, but goal is to obtain Zepbound  in future after repeat sleep study results obtained. Zepbound  being FDA approved for OSA and obesity.  Recommended eating smaller high protein, low fat meals more frequently and exercising 30 mins a day 5 times a week with a goal of 10-15lb weight loss in the next 3 months. Patient voiced their understanding and motivation to adhere to these recommendations.  No family history of thyroid  cancer (MTC, MEN 2, thyroid  cell tumors) or pancreatitis.          Bilateral foot pain   Patient reported on this visit a worsening bilateral foot pain. Patient agreed for a  second opinion referral. Patient will be referred to another podiatrist       Relevant Orders   Ambulatory referral to Podiatry     Follow up plan: Return in about 6 weeks (around 08/07/2024) for HTN, IFG, OSA.      "

## 2024-06-28 ENCOUNTER — Other Ambulatory Visit: Payer: Self-pay | Admitting: Nurse Practitioner

## 2024-06-28 ENCOUNTER — Encounter: Payer: Self-pay | Admitting: Nurse Practitioner

## 2024-06-28 DIAGNOSIS — G4733 Obstructive sleep apnea (adult) (pediatric): Secondary | ICD-10-CM

## 2024-06-28 DIAGNOSIS — Z6841 Body Mass Index (BMI) 40.0 and over, adult: Secondary | ICD-10-CM

## 2024-06-28 MED ORDER — TIRZEPATIDE-WEIGHT MANAGEMENT 2.5 MG/0.5ML ~~LOC~~ SOLN: 2.5000 mg | SUBCUTANEOUS | 3 refills | Status: AC

## 2024-06-28 NOTE — Addendum Note (Signed)
 Addended by: Jamoni Broadfoot T on: 06/28/2024 05:02 PM   Modules accepted: Orders

## 2024-07-23 ENCOUNTER — Encounter: Admitting: Nurse Practitioner

## 2024-08-09 ENCOUNTER — Ambulatory Visit: Admitting: Nurse Practitioner

## 2025-06-23 ENCOUNTER — Ambulatory Visit: Admitting: Dermatology
